# Patient Record
Sex: Male | Born: 1935 | Race: White | Hispanic: No | Marital: Married | Smoking: Former smoker
Health system: Southern US, Community
[De-identification: ages and names within clinical notes are randomized; demographics above are authoritative.]

## PROBLEM LIST (undated history)

## (undated) DIAGNOSIS — I472 Ventricular tachycardia, unspecified: Secondary | ICD-10-CM

## (undated) DIAGNOSIS — I2510 Atherosclerotic heart disease of native coronary artery without angina pectoris: Secondary | ICD-10-CM

## (undated) DIAGNOSIS — R0602 Shortness of breath: Secondary | ICD-10-CM

## (undated) DIAGNOSIS — R55 Syncope and collapse: Secondary | ICD-10-CM

## (undated) DIAGNOSIS — R079 Chest pain, unspecified: Secondary | ICD-10-CM

## (undated) DIAGNOSIS — R42 Dizziness and giddiness: Secondary | ICD-10-CM

## (undated) DIAGNOSIS — I10 Essential (primary) hypertension: Secondary | ICD-10-CM

## (undated) DIAGNOSIS — F0390 Unspecified dementia without behavioral disturbance: Secondary | ICD-10-CM

## (undated) HISTORY — DX: Unspecified dementia, unspecified severity, without behavioral disturbance, psychotic disturbance, mood disturbance, and anxiety: F03.90

## (undated) HISTORY — DX: Ventricular tachycardia: I47.2

## (undated) HISTORY — DX: Shortness of breath: R06.02

## (undated) HISTORY — DX: Atherosclerotic heart disease of native coronary artery without angina pectoris: I25.10

## (undated) HISTORY — DX: Dizziness and giddiness: R42

## (undated) HISTORY — DX: Ventricular tachycardia, unspecified: I47.20

## (undated) HISTORY — DX: Chest pain, unspecified: R07.9

## (undated) HISTORY — DX: Essential (primary) hypertension: I10

## (undated) HISTORY — DX: Syncope and collapse: R55

## (undated) HISTORY — PX: INSERT / REPLACE / REMOVE PACEMAKER: SUR710

---

## 2016-05-27 NOTE — Progress Notes (Signed)
Electrophysiology Office Note   Date:  05/28/2016   ID:  Erik Alvarez, DOB 1929-11-13, MRN [id number]  PCP:  Reymundo Poll, MD Primary Electrophysiologist:  Constance Haw, MD    Chief Complaint  Patient presents with  . Advice Only    pacemaker check     History of Present Illness: Erik Alvarez is a 82 y.o. male who presents today for electrophysiology evaluation.   He has a history of coronary artery disease, hypertension, and an ICD was implanted secondary to syncope with inducible ventricular tachycardia. He also has a history of dementia.    Today, he denies symptoms of palpitations, chest pain, shortness of breath, orthopnea, PND, lower extremity edema, claudication, dizziness, presyncope, syncope, bleeding, or neurologic sequela. The patient is tolerating medications without difficulties and is otherwise without complaint today.    Past Medical History:  Diagnosis Date  . Chest pain   . Coronary artery disease   . Dementia   . Dizziness   . Hypertension   . SOB (shortness of breath)   . Syncope   . VT (ventricular tachycardia) ([place])    Past Surgical History:  Procedure Laterality Date  . INSERT / REPLACE / REMOVE PACEMAKER       Current Outpatient Prescriptions  Medication Sig Dispense Refill  . aspirin EC 81 MG tablet Take 81 mg by mouth daily.    . bisacodyl (BISACODYL) 5 MG EC tablet Take 10 mg by mouth daily as needed for moderate constipation.    . carvedilol (COREG) 12.5 MG tablet Take 12.5 mg by mouth 2 (two) times daily with a meal.    . ciprofloxacin (CIPRO) 250 MG tablet Take 250 mg by mouth 2 (two) times daily.    . ergocalciferol (VITAMIN D2) 50000 units capsule Take 50,000 Units by mouth once a week.    . Memantine HCl-Donepezil HCl (NAMZARIC) 28-10 MG CP24 Take by mouth daily.    . simvastatin (ZOCOR) 40 MG tablet Take 40 mg by mouth daily at 6 PM.    . tamsulosin (FLOMAX) 0.4 MG CAPS capsule Take 0.4 mg by mouth.     No current  facility-administered medications for this visit.     Allergies:   Review of patient's allergies indicates no known allergies.   Social History:  The patient  reports that he has quit smoking. He has never used smokeless tobacco. He reports that he drinks alcohol. He reports that he does not use drugs.   Family History:  The patient's family history includes Diabetes in his brother.    ROS:  Please see the history of present illness.   Otherwise, review of systems is positive for snoring, back pain, easy bruising.   All other systems are reviewed and negative.    PHYSICAL EXAM: VS:  BP (!) 110/58   Pulse 60   Ht '5\' 8"'$  (1.727 m)   Wt 147 lb 12.8 oz (67 kg)   BMI 22.47 kg/m  , BMI Body mass index is 22.47 kg/m. GEN: Well nourished, well developed, in no acute distress  HEENT: normal  Neck: no JVD, carotid bruits, or masses Cardiac: RRR; no murmurs, rubs, or gallops,no edema  Respiratory:  clear to auscultation bilaterally, normal work of breathing GI: soft, nontender, nondistended, + BS MS: no deformity or atrophy  Skin: warm and dry,  device pocket is well healed Neuro:  Strength and sensation are intact Psych: euthymic mood, full affect  EKG:  EKG is ordered today. Personal review of  the ekg ordered shows A paced, RBBB, LAFB  Device interrogation is reviewed today in detail.  See PaceArt for details. Device battery is at RRT.   Recent Labs: No results found for requested labs within last 8760 hours.    Lipid Panel  No results found for: CHOL, TRIG, HDL, CHOLHDL, VLDL, LDLCALC, LDLDIRECT   Wt Readings from Last 3 Encounters:  05/28/16 147 lb 12.8 oz (67 kg)      Other studies Reviewed: Additional studies/ records that were reviewed today include: Cardiology notes   ASSESSMENT AND PLAN:  1.  Inducible ventricular tachycardia: ICD implanted previously.  Had an episode of syncope which was the reason that EP study was performed. He has had one episode of  nonsustained VT at 60 beats since his most recent check. He does need his generator changed out currently. A long discussion with him and his wife, and his daughter about the option of GEN greater change to a pacemaker. He says that he feels like he would prefer a pacemaker and would want to be DO NOT RESUSCITATE status, with no life-saving measures done. We'll therefore plan for generator change to a pacemaker.  2. Coronary artery disease: s/p RCA stent 2010.  3. Hypertension: on coreg.  4. Hyperlipidemia: continue simvastatin.   Current medicines are reviewed at length with the patient today.   The patient does not have concerns regarding his medicines.  The following changes were made today:  none  Labs/ tests ordered today include:  No orders of the defined types were placed in this encounter.    Disposition:   FU with Timisha Mondry 3 months  Signed, Tieler Cournoyer Meredith Leeds, MD  05/28/2016 12:23 PM     [facility] [address] [place] [address] [phone] (office) [phone] (fax)

## 2016-05-28 ENCOUNTER — Ambulatory Visit (INDEPENDENT_AMBULATORY_CARE_PROVIDER_SITE_OTHER): Payer: Medicare Other | Admitting: Cardiology

## 2016-05-28 ENCOUNTER — Encounter: Payer: Self-pay | Admitting: Cardiology

## 2016-05-28 ENCOUNTER — Encounter (INDEPENDENT_AMBULATORY_CARE_PROVIDER_SITE_OTHER): Payer: Self-pay

## 2016-05-28 VITALS — BP 110/58 | HR 60 | Ht 68.0 in | Wt 147.8 lb

## 2016-05-28 DIAGNOSIS — R55 Syncope and collapse: Secondary | ICD-10-CM | POA: Diagnosis not present

## 2016-05-28 DIAGNOSIS — I472 Ventricular tachycardia, unspecified: Secondary | ICD-10-CM

## 2016-05-28 LAB — CUP PACEART INCLINIC DEVICE CHECK
Battery Voltage: 2.6 V
Brady Statistic AP VS Percent: 96.14 %
Brady Statistic RA Percent Paced: 96.17 %
Brady Statistic RV Percent Paced: 0.03 %
HIGH POWER IMPEDANCE MEASURED VALUE: 49 Ohm
HighPow Impedance: 59 Ohm
Implantable Lead Location: 753860
Implantable Lead Model: 4470
Implantable Lead Model: 6947
Implantable Lead Serial Number: 661592
Lead Channel Pacing Threshold Amplitude: 0.75 V
Lead Channel Pacing Threshold Amplitude: 0.75 V
Lead Channel Pacing Threshold Pulse Width: 0.4 ms
Lead Channel Sensing Intrinsic Amplitude: 13.625 mV
Lead Channel Sensing Intrinsic Amplitude: 3.125 mV
Lead Channel Setting Pacing Amplitude: 2 V
Lead Channel Setting Sensing Sensitivity: 0.3 mV
MDC IDC LEAD IMPLANT DT: 20101019
MDC IDC LEAD IMPLANT DT: 20101019
MDC IDC LEAD LOCATION: 753859
MDC IDC MSMT LEADCHNL RA IMPEDANCE VALUE: 437 Ohm
MDC IDC MSMT LEADCHNL RV IMPEDANCE VALUE: 437 Ohm
MDC IDC MSMT LEADCHNL RV PACING THRESHOLD PULSEWIDTH: 0.4 ms
MDC IDC SESS DTM: 20171003155149
MDC IDC SET LEADCHNL RA PACING AMPLITUDE: 2 V
MDC IDC SET LEADCHNL RV PACING PULSEWIDTH: 0.4 ms
MDC IDC STAT BRADY AP VP PERCENT: 0.03 %
MDC IDC STAT BRADY AS VP PERCENT: 0 %
MDC IDC STAT BRADY AS VS PERCENT: 3.83 %

## 2016-05-28 NOTE — Patient Instructions (Signed)
Medication Instructions:    Your physician recommends that you continue on your current medications as directed. Please refer to the Current Medication list given to you today.  --- If you need a refill on your cardiac medications before your next appointment, please call your pharmacy. ---  Labwork:  None  ordered  Testing/Procedures: Your physician has recommended that you have your defibrillator downgraded to a pacemaker.  Aruna Nestler, RN will call you to arrange this procedure.  Follow-Up:  To be determined once procedure is scheduled.  Thank you for choosing [facility]!!   Trinidad Curet, RN [phone]  Any Other Special Instructions Will Be Listed Below (If Applicable).

## 2016-05-30 ENCOUNTER — Emergency Department (HOSPITAL_COMMUNITY)
Admission: EM | Admit: 2016-05-30 | Discharge: 2016-05-30 | Disposition: A | Discharge status: Home / Self Care | Payer: Medicare Other | Attending: Emergency Medicine | Admitting: Emergency Medicine

## 2016-05-30 DIAGNOSIS — Y9389 Activity, other specified: Secondary | ICD-10-CM | POA: Diagnosis not present

## 2016-05-30 DIAGNOSIS — Z87891 Personal history of nicotine dependence: Secondary | ICD-10-CM | POA: Insufficient documentation

## 2016-05-30 DIAGNOSIS — I2510 Atherosclerotic heart disease of native coronary artery without angina pectoris: Secondary | ICD-10-CM | POA: Diagnosis not present

## 2016-05-30 DIAGNOSIS — Z79899 Other long term (current) drug therapy: Secondary | ICD-10-CM | POA: Insufficient documentation

## 2016-05-30 DIAGNOSIS — W19XXXA Unspecified fall, initial encounter: Secondary | ICD-10-CM | POA: Insufficient documentation

## 2016-05-30 DIAGNOSIS — Z7982 Long term (current) use of aspirin: Secondary | ICD-10-CM | POA: Diagnosis not present

## 2016-05-30 DIAGNOSIS — Y999 Unspecified external cause status: Secondary | ICD-10-CM | POA: Insufficient documentation

## 2016-05-30 DIAGNOSIS — Z950 Presence of cardiac pacemaker: Secondary | ICD-10-CM | POA: Diagnosis not present

## 2016-05-30 DIAGNOSIS — I10 Essential (primary) hypertension: Secondary | ICD-10-CM | POA: Insufficient documentation

## 2016-05-30 DIAGNOSIS — Y92009 Unspecified place in unspecified non-institutional (private) residence as the place of occurrence of the external cause: Secondary | ICD-10-CM | POA: Insufficient documentation

## 2016-05-30 DIAGNOSIS — R55 Syncope and collapse: Secondary | ICD-10-CM | POA: Insufficient documentation

## 2016-05-30 LAB — BASIC METABOLIC PANEL
ANION GAP: 9 (ref 5–15)
BUN: 17 mg/dL (ref 6–20)
CHLORIDE: 104 mmol/L (ref 101–111)
CO2: 23 mmol/L (ref 22–32)
Calcium: 8.9 mg/dL (ref 8.9–10.3)
Creatinine, Ser: 1.07 mg/dL (ref 0.61–1.24)
GFR calc non Af Amer: >60 mL/min (ref >60)
GLUCOSE: 111 mg/dL — AB (ref 65–99)
POTASSIUM: 4.8 mmol/L (ref 3.5–5.1)
Sodium: 136 mmol/L (ref 135–145)

## 2016-05-30 LAB — CBC WITH DIFFERENTIAL/PLATELET
Basophils Absolute: 0 K/uL (ref 0.0–0.1)
Basophils Relative: 0 %
Eosinophils Absolute: 0.1 K/uL (ref 0.0–0.7)
Eosinophils Relative: 1 %
HCT: 36 % — ABNORMAL LOW (ref 39.0–52.0)
Hemoglobin: 11.7 g/dL — ABNORMAL LOW (ref 13.0–17.0)
Lymphocytes Relative: 18 %
Lymphs Abs: 1.2 K/uL (ref 0.7–4.0)
MCH: 29.3 pg (ref 26.0–34.0)
MCHC: 32.5 g/dL (ref 30.0–36.0)
MCV: 90 fL (ref 78.0–100.0)
Monocytes Absolute: 0.9 K/uL (ref 0.1–1.0)
Monocytes Relative: 13 %
Neutro Abs: 4.6 K/uL (ref 1.7–7.7)
Neutrophils Relative %: 68 %
Platelets: 158 K/uL (ref 150–400)
RBC: 4 MIL/uL — ABNORMAL LOW (ref 4.22–5.81)
RDW: 12.8 % (ref 11.5–15.5)
WBC: 6.8 K/uL (ref 4.0–10.5)

## 2016-05-30 LAB — URINE MICROSCOPIC-ADD ON

## 2016-05-30 LAB — URINALYSIS, ROUTINE W REFLEX MICROSCOPIC
Bilirubin Urine: NEGATIVE
Glucose, UA: 250 mg/dL — AB
Ketones, ur: NEGATIVE mg/dL
Nitrite: NEGATIVE
Protein, ur: 30 mg/dL — AB
Specific Gravity, Urine: 1.023 (ref 1.005–1.030)
pH: 5.5 (ref 5.0–8.0)

## 2016-05-30 MED ORDER — SODIUM CHLORIDE 0.9 % IV BOLUS (SEPSIS)
500.0000 mL | Freq: Once | INTRAVENOUS | Status: AC
  Administered 2016-05-30: 500 mL via INTRAVENOUS

## 2016-05-30 NOTE — ED Provider Notes (Signed)
[facility] DEPT Provider Note   CSN: [id number] Arrival date & time: 05/30/16  1321   LEVEL 5 CAVEAT - DEMENTIA  History   Chief Complaint Chief Complaint  Patient presents with  . Fall    HPI Erik Alvarez is a 82 y.o. male.  HPI  82 year old male with a history of dementia, ventricular tachycardia with current pacemaker/ICD, presents with his daughter by EMS after syncope. The history is taken from the daughter. She states that patient and his wife live in independent living. He was in the kitchen making breakfast when all of a sudden he went unresponsive and slid down to the floor. He did not appear to hit his head or injure himself. Wife states he was completely unresponsive for about one or 2 minutes. Seem to be confused when he woke up with EMS around him. No seizure like activity. Currently in the ED the patient is at his mental baseline. Patient has not been ill recently say for multiple episodes of urinary retention. He has had multiple Foley's over the last 6 weeks. Was recently saw his urologist 3 days ago and had a new Foley placed because he cannot urinate on his own. He was also placed on Cipro. He has not had any recent vomiting or diarrhea. He has been eating okay but drinks very little water and only drinks when he eats. Daughter has noticed over the last 3 days that his urine output has been less through the Foley. Patient has not been complaining of or currently complaining of headache, chest pain, or shortness of breath. He currently feels lightheaded but denies headache or room spinning.   Past Medical History:  Diagnosis Date  . Chest pain   . Coronary artery disease   . Dementia   . Dizziness   . Hypertension   . SOB (shortness of breath)   . Syncope   . VT (ventricular tachycardia) ([place])     There are no active problems to display for this patient.   Past Surgical History:  Procedure Laterality Date  . INSERT / REPLACE / REMOVE PACEMAKER      OB  History    No data available       Home Medications    Prior to Admission medications   Medication Sig Start Date End Date Taking? Authorizing Provider  aspirin EC 81 MG tablet Take 81 mg by mouth daily.   Yes Historical Provider, MD  carvedilol (COREG) 12.5 MG tablet Take 12.5 mg by mouth 2 (two) times daily with a meal.   Yes Historical Provider, MD  ciprofloxacin (CIPRO) 250 MG tablet Take 250 mg by mouth 2 (two) times daily.   Yes Historical Provider, MD  ergocalciferol (VITAMIN D2) 50000 units capsule Take 50,000 Units by mouth once a week.   Yes Historical Provider, MD  Memantine HCl-Donepezil HCl (NAMZARIC) 28-10 MG CP24 Take by mouth daily.   Yes Historical Provider, MD  simvastatin (ZOCOR) 40 MG tablet Take 40 mg by mouth daily at 6 PM.   Yes Historical Provider, MD  tamsulosin (FLOMAX) 0.4 MG CAPS capsule Take 0.4 mg by mouth daily.    Yes Historical Provider, MD    Family History Family History  Problem Relation Age of Onset  . Diabetes Brother     Social History Social History  Substance Use Topics  . Smoking status: Former Research scientist (life sciences)  . Smokeless tobacco: Never Used  . Alcohol use Yes     Allergies   Review of patient's allergies  indicates no known allergies.   Review of Systems Review of Systems  Unable to perform ROS: Dementia     Physical Exam Updated Vital Signs BP 121/99   Pulse (!) 59   Temp 98.1 F (36.7 C) (Oral)   Resp 18   SpO2 97%   Physical Exam  Constitutional: He appears well-developed and well-nourished. No distress.  HENT:  Head: Normocephalic and atraumatic.  Right Ear: External ear normal.  Left Ear: External ear normal.  Nose: Nose normal.  Mouth/Throat: Oropharynx is clear and moist.  Eyes: EOM are normal. Right eye exhibits no discharge. Left eye exhibits no discharge.  Pin point pupils bilaterally  Neck: Neck supple.  Cardiovascular: Normal rate, regular rhythm and normal heart sounds.   Pulmonary/Chest: Effort normal and  breath sounds normal.  Abdominal: Soft. There is no tenderness.  Genitourinary:  Genitourinary Comments: Foley catheter in place. Yellow urine in foley bag  Musculoskeletal: He exhibits no edema.  Neurological: He is alert. He is disoriented.  Alert and oriented to self and place. Knows that it is fall and is off on day of week by one day. Does not know month or year. This is baseline per daughter CN 3-12 grossly intact. 5/5 strength in all 4 extremities. Grossly normal sensation. Normal finger to nose.   Skin: Skin is warm and dry. He is not diaphoretic.  Nursing note and vitals reviewed.    ED Treatments / Results  Labs (all labs ordered are listed, but only abnormal results are displayed) Labs Reviewed  BASIC METABOLIC PANEL - Abnormal; Notable for the following:       Result Value   Glucose, Bld 111 (*)    All other components within normal limits  CBC WITH DIFFERENTIAL/PLATELET - Abnormal; Notable for the following:    RBC 4.00 (*)    Hemoglobin 11.7 (*)    HCT 36.0 (*)    All other components within normal limits  URINALYSIS, ROUTINE W REFLEX MICROSCOPIC (NOT AT [facility]) - Abnormal; Notable for the following:    Color, Urine AMBER (*)    APPearance CLOUDY (*)    Glucose, UA 250 (*)    Hgb urine dipstick SMALL (*)    Protein, ur 30 (*)    Leukocytes, UA MODERATE (*)    All other components within normal limits  URINE MICROSCOPIC-ADD ON - Abnormal; Notable for the following:    Squamous Epithelial / LPF 0-5 (*)    Bacteria, UA RARE (*)    All other components within normal limits  URINE CULTURE    EKG  EKG Interpretation  Date/Time:  Thursday May 30 2016 13:26:11 EDT Ventricular Rate:  61 PR Interval:    QRS Duration: 135 QT Interval:  453 QTC Calculation: 453 R Axis:   -64 Text Interpretation:  Normal sinus rhythm Atrial premature complex Nonspecific IVCD with LAD Inferior infarct, old No old tracing to compare Confirmed by Cecil Bixby MD, Arcenio Mullaly [phone]) on  05/30/2016 1:49:36 PM       Radiology No results found.  Procedures Procedures (including critical care time)  Medications Ordered in ED Medications  sodium chloride 0.9 % bolus 500 mL (0 mLs Intravenous Stopped 05/30/16 1530)     Initial Impression / Assessment and Plan / ED Course  I have reviewed the triage vital signs and the nursing notes.  Pertinent labs & imaging results that were available during my care of the patient were reviewed by me and considered in my medical decision making (see chart for  details).  Clinical Course  Comment By Time  Will check labs, urine/culture (although already on cipro, this is more for the culture to r/o resistance to cipro) and interrogate medtronic pacer/icd. Fluids. Sherwood Gambler, MD 10/05 1406  Medtronic interrogation shows no arrhythmias or shocks. Low battery (which daughter knows about). Waiting on labs Sherwood Gambler, MD 10/05 1438  Patient feels better, no further lightheadedness after eating 2 protein bards. Hemoglobin mildly low, but above 9/10 when at OSH. BMP benign. Urine culture will be sent but he's already on cipro, would only need changes if resistant. However no abd pain, fevers, vomiting to suggest infection.  Sherwood Gambler, MD 10/05 1637    Pacemaker interrogation shows no acute events. No obvious cause for patient's syncope. Does not seem consistent with seizures. Given he was still lightheaded on arrival and better after fluids and eating, I think it was probably from poor oral intake. Labs benign. Currently on Cipro for urine retention, will need this changed if urine culture shows resistance. Otherwise follow-up with PCP. Discussed return precautions. Is acting at his mental baseline.  Final Clinical Impressions(s) / ED Diagnoses   Final diagnoses:  Syncope, unspecified syncope type    New Prescriptions Discharge Medication List as of 05/30/2016  4:38 PM       Sherwood Gambler, MD 05/30/16 1715

## 2016-05-30 NOTE — ED Triage Notes (Signed)
Patient at home making coffee. Pt states that he became dizzy and fell.  C/O pain in bilateral flanks.  [facility] per EMS

## 2016-05-31 LAB — URINE CULTURE

## 2016-05-31 NOTE — Addendum Note (Signed)
Addended by: Stanton Kidney on: 05/31/2016 10:43 AM   Modules accepted: Orders

## 2016-06-02 ENCOUNTER — Encounter (HOSPITAL_COMMUNITY): Payer: Self-pay | Admitting: *Deleted

## 2016-06-02 ENCOUNTER — Emergency Department (HOSPITAL_COMMUNITY)
Admission: EM | Admit: 2016-06-02 | Discharge: 2016-06-02 | Disposition: A | Discharge status: Home / Self Care | Payer: Medicare Other | Attending: Emergency Medicine | Admitting: Emergency Medicine

## 2016-06-02 ENCOUNTER — Emergency Department (HOSPITAL_COMMUNITY): Payer: Medicare Other

## 2016-06-02 DIAGNOSIS — Z7982 Long term (current) use of aspirin: Secondary | ICD-10-CM | POA: Insufficient documentation

## 2016-06-02 DIAGNOSIS — Z79899 Other long term (current) drug therapy: Secondary | ICD-10-CM | POA: Diagnosis not present

## 2016-06-02 DIAGNOSIS — M5440 Lumbago with sciatica, unspecified side: Secondary | ICD-10-CM | POA: Diagnosis not present

## 2016-06-02 DIAGNOSIS — M545 Low back pain: Secondary | ICD-10-CM | POA: Diagnosis present

## 2016-06-02 DIAGNOSIS — Z87891 Personal history of nicotine dependence: Secondary | ICD-10-CM | POA: Insufficient documentation

## 2016-06-02 DIAGNOSIS — I2510 Atherosclerotic heart disease of native coronary artery without angina pectoris: Secondary | ICD-10-CM | POA: Diagnosis not present

## 2016-06-02 DIAGNOSIS — I10 Essential (primary) hypertension: Secondary | ICD-10-CM | POA: Diagnosis not present

## 2016-06-02 MED ORDER — HYDROMORPHONE HCL 1 MG/ML IJ SOLN
1.0000 mg | Freq: Once | INTRAMUSCULAR | Status: AC
  Administered 2016-06-02: 1 mg via INTRAMUSCULAR
  Filled 2016-06-02: qty 1

## 2016-06-02 MED ORDER — TRAMADOL HCL 50 MG PO TABS: ORAL_TABLET | ORAL | 0 refills | Status: DC

## 2016-06-02 NOTE — ED Triage Notes (Signed)
Pt arrived [facility] by EMS. C/o mid to lower back pain, no abdominal pain or radiating to anywhere. Pain does increase with movement. No recent trauma. Per Staff no fever, hematuria, or h/o renal disease. Has had several visit to ED with Back pain.  Does not remember what treatment what used. No neuro deficits and PMS intact.

## 2016-06-02 NOTE — ED Provider Notes (Signed)
[facility] DEPT Provider Note   CSN: [id number] Arrival date & time: 06/02/16  0806     History   Chief Complaint Chief Complaint  Patient presents with  . Back Pain    HPI Erik Alvarez is a 82 y.o. male.  Patient fell last week onto his buttocks.   The history is provided by a relative. No language interpreter was used.  Back Pain   This is a recurrent problem. The current episode started more than 2 days ago. The problem occurs constantly. The problem has not changed since onset.Associated with: fall. The pain is present in the lumbar spine. The quality of the pain is described as shooting. The pain does not radiate. The pain is at a severity of 5/10. The pain is moderate. The symptoms are aggravated by bending. The pain is worse during the day. Pertinent negatives include no chest pain, no headaches and no abdominal pain.    Past Medical History:  Diagnosis Date  . Chest pain   . Coronary artery disease   . Dementia   . Dizziness   . Hypertension   . SOB (shortness of breath)   . Syncope   . VT (ventricular tachycardia) ([place])     There are no active problems to display for this patient.   Past Surgical History:  Procedure Laterality Date  . INSERT / REPLACE / REMOVE PACEMAKER      OB History    No data available       Home Medications    Prior to Admission medications   Medication Sig Start Date End Date Taking? Authorizing Provider  aspirin EC 81 MG tablet Take 81 mg by mouth daily.   Yes Historical Provider, MD  carvedilol (COREG) 12.5 MG tablet Take 12.5 mg by mouth 2 (two) times daily with a meal.   Yes Historical Provider, MD  ergocalciferol (VITAMIN D2) 50000 units capsule Take 50,000 Units by mouth once a week.   Yes Historical Provider, MD  Memantine HCl-Donepezil HCl (NAMZARIC) 28-10 MG CP24 Take 1 tablet by mouth daily.    Yes Historical Provider, MD  simvastatin (ZOCOR) 40 MG tablet Take 40 mg by mouth daily at 6 PM.   Yes Historical  Provider, MD  tamsulosin (FLOMAX) 0.4 MG CAPS capsule Take 0.4 mg by mouth daily.    Yes Historical Provider, MD  traMADol (ULTRAM) 50 MG tablet Take 1 every 6 hours for pain not helped by Tylenol 06/02/16   Milton Ferguson, MD    Family History Family History  Problem Relation Age of Onset  . Diabetes Brother     Social History Social History  Substance Use Topics  . Smoking status: Former Research scientist (life sciences)  . Smokeless tobacco: Never Used  . Alcohol use Yes     Allergies   Review of patient's allergies indicates no known allergies.   Review of Systems Review of Systems  Constitutional: Negative for appetite change and fatigue.  HENT: Negative for congestion, ear discharge and sinus pressure.   Eyes: Negative for discharge.  Respiratory: Negative for cough.   Cardiovascular: Negative for chest pain.  Gastrointestinal: Negative for abdominal pain and diarrhea.  Genitourinary: Negative for frequency and hematuria.  Musculoskeletal: Positive for back pain.  Skin: Negative for rash.  Neurological: Negative for seizures and headaches.  Psychiatric/Behavioral: Negative for hallucinations.     Physical Exam Updated Vital Signs BP 144/59 (BP Location: Left Arm)   Pulse 60   Temp 97.7 F (36.5 C) (Oral)   Resp  19   SpO2 94%   Physical Exam  Constitutional: He is oriented to person, place, and time. He appears well-developed.  HENT:  Head: Normocephalic.  Eyes: Conjunctivae and EOM are normal. No scleral icterus.  Neck: Neck supple. No thyromegaly present.  Cardiovascular: Normal rate and regular rhythm.  Exam reveals no gallop and no friction rub.   No murmur heard. Pulmonary/Chest: No stridor. He has no wheezes. He has no rales. He exhibits no tenderness.  Abdominal: He exhibits no distension. There is no tenderness. There is no rebound.  Musculoskeletal: Normal range of motion. He exhibits no edema.  Tender lumbar spine  Lymphadenopathy:    He has no cervical adenopathy.    Neurological: He is oriented to person, place, and time. He exhibits normal muscle tone. Coordination normal.  Skin: No rash noted. No erythema.  Psychiatric: He has a normal mood and affect. His behavior is normal.     ED Treatments / Results  Labs (all labs ordered are listed, but only abnormal results are displayed) Labs Reviewed - No data to display  EKG  EKG Interpretation None       Radiology Dg Lumbar Spine Complete  Result Date: 06/02/2016 CLINICAL DATA:  Lumbago EXAM: LUMBAR SPINE - COMPLETE 4+ VIEW COMPARISON:  None. FINDINGS: Frontal, lateral, spot lumbosacral lateral, and bilateral oblique views were obtained. There are 5 non-rib-bearing lumbar type vertebral bodies. There is mild rotatory scoliosis. There is no fracture or spondylolisthesis. There is moderately severe disc space narrowing at L4-5 and L5-S1. There is moderate disc space narrowing at L3-4. There is facet osteoarthritic change at L4-5 and L5-S1 bilaterally. There is extensive calcification in the aorta and iliac arteries. IMPRESSION: Osteoarthritic changes several levels. Mild scoliosis. No fracture or spondylolisthesis. There is extensive aortoiliac atherosclerosis. Electronically Signed   By: Lowella Grip III M.D.   On: 06/02/2016 09:55   Dg Pelvis 1-2 Views  Result Date: 06/02/2016 CLINICAL DATA:  Pain EXAM: PELVIS - 1-2 VIEW COMPARISON:  None. FINDINGS: There is no evidence of pelvic fracture or dislocation. Hip joints appear symmetric bilaterally. Sacroiliac joints appear symmetric bilaterally. No erosive change. IMPRESSION: No fracture or dislocation.  The joint spaces appear intact. Electronically Signed   By: Lowella Grip III M.D.   On: 06/02/2016 09:56    Procedures Procedures (including critical care time)  Medications Ordered in ED Medications  HYDROmorphone (DILAUDID) injection 1 mg (1 mg Intramuscular Given 06/02/16 1022)     Initial Impression / Assessment and Plan / ED Course   I have reviewed the triage vital signs and the nursing notes.  Pertinent labs & imaging results that were available during my care of the patient were reviewed by me and considered in my medical decision making (see chart for details).  Clinical Course   Pt with no fx on x-ray.  tx with tylenol and ultram and follow up  Final Clinical Impressions(s) / ED Diagnoses   Final diagnoses:  Bilateral low back pain with sciatica, sciatica laterality unspecified, unspecified chronicity    New Prescriptions New Prescriptions   TRAMADOL (ULTRAM) 50 MG TABLET    Take 1 every 6 hours for pain not helped by Tylenol     Milton Ferguson, MD 06/02/16 1140

## 2016-06-02 NOTE — Discharge Instructions (Signed)
Follow up with your md as needed °

## 2016-06-02 NOTE — ED Notes (Signed)
Patient transported to X-ray 

## 2016-06-02 NOTE — ED Notes (Signed)
Bed: IZ12 Expected date: 06/02/16 Expected time: 8:04 AM Means of arrival: Ambulance Comments: Back pain from [facility]

## 2016-06-02 NOTE — ED Notes (Signed)
Family at bedside, Daughter Baker Janus

## 2016-06-03 ENCOUNTER — Telehealth: Payer: Self-pay | Admitting: Cardiology

## 2016-06-03 NOTE — Telephone Encounter (Signed)
New message      Calling to follow up on pt needing a battery changeout.  Has this been scheduled?  Please call

## 2016-06-03 NOTE — Telephone Encounter (Signed)
Spoke with dtr.  Will discuss implant with Dr. Curt Bears tomorrow and call her back.  She will discuss pt care w/ his PCP tomorrow.  She will also get [place] hospital information for me.

## 2016-06-06 ENCOUNTER — Encounter: Payer: Self-pay | Admitting: Cardiology

## 2016-06-07 ENCOUNTER — Encounter: Payer: Self-pay | Admitting: Cardiology

## 2016-06-12 ENCOUNTER — Encounter: Payer: Self-pay | Admitting: Cardiology

## 2016-06-14 ENCOUNTER — Encounter: Payer: Self-pay | Admitting: *Deleted

## 2016-06-14 NOTE — Telephone Encounter (Signed)
ICD downgrade to PPM scheduled 11/2. Pre procedure labs 10/25. Wound check scheduled for 11/16. Letter of instructions reviewed with dtr and sent through [facility]. Dtr verbalized understanding and agreeable to plan.

## 2016-06-19 ENCOUNTER — Other Ambulatory Visit (INDEPENDENT_AMBULATORY_CARE_PROVIDER_SITE_OTHER): Payer: Medicare Other | Admitting: *Deleted

## 2016-06-19 ENCOUNTER — Other Ambulatory Visit: Payer: Self-pay | Admitting: *Deleted

## 2016-06-19 ENCOUNTER — Other Ambulatory Visit: Payer: Self-pay | Admitting: Cardiology

## 2016-06-19 DIAGNOSIS — I472 Ventricular tachycardia, unspecified: Secondary | ICD-10-CM

## 2016-06-19 LAB — BASIC METABOLIC PANEL
BUN: 24 mg/dL (ref 7–25)
CALCIUM: 8.8 mg/dL (ref 8.6–10.3)
CO2: 24 mmol/L (ref 20–31)
CREATININE: 1.3 mg/dL — AB (ref 0.70–1.11)
Chloride: 102 mmol/L (ref 98–110)
GLUCOSE: 211 mg/dL — AB (ref 65–99)
Potassium: 4.7 mmol/L (ref 3.5–5.3)
SODIUM: 138 mmol/L (ref 135–146)

## 2016-06-19 LAB — CBC WITH DIFFERENTIAL/PLATELET
BASOS ABS: 0 {cells}/uL (ref 0–200)
Basophils Relative: 0 %
EOS ABS: 86 {cells}/uL (ref 15–500)
EOS PCT: 1 %
HCT: 32.7 % — ABNORMAL LOW (ref 38.5–50.0)
HEMOGLOBIN: 11.1 g/dL — AB (ref 13.2–17.1)
LYMPHS ABS: 1204 {cells}/uL (ref 850–3900)
Lymphocytes Relative: 14 %
MCH: 29.4 pg (ref 27.0–33.0)
MCHC: 33.9 g/dL (ref 32.0–36.0)
MCV: 86.5 fL (ref 80.0–100.0)
MPV: 9.4 fL (ref 7.5–12.5)
Monocytes Absolute: 946 cells/uL (ref 200–950)
Monocytes Relative: 11 %
NEUTROS PCT: 74 %
Neutro Abs: 6364 cells/uL (ref 1500–7800)
Platelets: 308 10*3/uL (ref 140–400)
RBC: 3.78 MIL/uL — ABNORMAL LOW (ref 4.20–5.80)
RDW: 12.9 % (ref 11.0–15.0)
WBC: 8.6 10*3/uL (ref 3.8–10.8)

## 2016-06-20 ENCOUNTER — Encounter: Payer: Self-pay | Admitting: *Deleted

## 2016-06-22 ENCOUNTER — Observation Stay (HOSPITAL_COMMUNITY): Payer: Medicare Other

## 2016-06-22 ENCOUNTER — Encounter (HOSPITAL_COMMUNITY): Payer: Self-pay | Admitting: Emergency Medicine

## 2016-06-22 ENCOUNTER — Emergency Department (HOSPITAL_COMMUNITY): Payer: Medicare Other

## 2016-06-22 ENCOUNTER — Inpatient Hospital Stay (HOSPITAL_COMMUNITY)
Admission: EM | Admit: 2016-06-22 | Discharge: 2016-06-28 | DRG: 180 | Disposition: A | Discharge status: Hospice | Payer: Medicare Other | Attending: Family Medicine | Admitting: Family Medicine

## 2016-06-22 DIAGNOSIS — Z955 Presence of coronary angioplasty implant and graft: Secondary | ICD-10-CM

## 2016-06-22 DIAGNOSIS — C782 Secondary malignant neoplasm of pleura: Secondary | ICD-10-CM | POA: Diagnosis not present

## 2016-06-22 DIAGNOSIS — I2510 Atherosclerotic heart disease of native coronary artery without angina pectoris: Secondary | ICD-10-CM | POA: Diagnosis present

## 2016-06-22 DIAGNOSIS — Z79891 Long term (current) use of opiate analgesic: Secondary | ICD-10-CM

## 2016-06-22 DIAGNOSIS — C3490 Malignant neoplasm of unspecified part of unspecified bronchus or lung: Secondary | ICD-10-CM | POA: Diagnosis present

## 2016-06-22 DIAGNOSIS — I252 Old myocardial infarction: Secondary | ICD-10-CM

## 2016-06-22 DIAGNOSIS — I255 Ischemic cardiomyopathy: Secondary | ICD-10-CM | POA: Diagnosis present

## 2016-06-22 DIAGNOSIS — Y92099 Unspecified place in other non-institutional residence as the place of occurrence of the external cause: Secondary | ICD-10-CM

## 2016-06-22 DIAGNOSIS — J910 Malignant pleural effusion: Secondary | ICD-10-CM | POA: Diagnosis present

## 2016-06-22 DIAGNOSIS — R079 Chest pain, unspecified: Secondary | ICD-10-CM

## 2016-06-22 DIAGNOSIS — I313 Pericardial effusion (noninflammatory): Secondary | ICD-10-CM | POA: Diagnosis present

## 2016-06-22 DIAGNOSIS — J90 Pleural effusion, not elsewhere classified: Secondary | ICD-10-CM

## 2016-06-22 DIAGNOSIS — N401 Enlarged prostate with lower urinary tract symptoms: Secondary | ICD-10-CM | POA: Diagnosis present

## 2016-06-22 DIAGNOSIS — Z95810 Presence of automatic (implantable) cardiac defibrillator: Secondary | ICD-10-CM

## 2016-06-22 DIAGNOSIS — F0390 Unspecified dementia without behavioral disturbance: Secondary | ICD-10-CM | POA: Diagnosis present

## 2016-06-22 DIAGNOSIS — R4189 Other symptoms and signs involving cognitive functions and awareness: Secondary | ICD-10-CM

## 2016-06-22 DIAGNOSIS — I249 Acute ischemic heart disease, unspecified: Secondary | ICD-10-CM | POA: Diagnosis present

## 2016-06-22 DIAGNOSIS — R41841 Cognitive communication deficit: Secondary | ICD-10-CM | POA: Diagnosis not present

## 2016-06-22 DIAGNOSIS — C459 Mesothelioma, unspecified: Secondary | ICD-10-CM | POA: Diagnosis present

## 2016-06-22 DIAGNOSIS — I10 Essential (primary) hypertension: Secondary | ICD-10-CM | POA: Diagnosis present

## 2016-06-22 DIAGNOSIS — Z87891 Personal history of nicotine dependence: Secondary | ICD-10-CM

## 2016-06-22 DIAGNOSIS — J9811 Atelectasis: Secondary | ICD-10-CM | POA: Diagnosis present

## 2016-06-22 DIAGNOSIS — Z79899 Other long term (current) drug therapy: Secondary | ICD-10-CM

## 2016-06-22 DIAGNOSIS — Z7982 Long term (current) use of aspirin: Secondary | ICD-10-CM

## 2016-06-22 DIAGNOSIS — Z66 Do not resuscitate: Secondary | ICD-10-CM

## 2016-06-22 DIAGNOSIS — W19XXXA Unspecified fall, initial encounter: Secondary | ICD-10-CM | POA: Diagnosis present

## 2016-06-22 DIAGNOSIS — Z515 Encounter for palliative care: Secondary | ICD-10-CM | POA: Diagnosis present

## 2016-06-22 DIAGNOSIS — J9601 Acute respiratory failure with hypoxia: Secondary | ICD-10-CM

## 2016-06-22 DIAGNOSIS — R338 Other retention of urine: Secondary | ICD-10-CM | POA: Diagnosis present

## 2016-06-22 DIAGNOSIS — E785 Hyperlipidemia, unspecified: Secondary | ICD-10-CM | POA: Diagnosis present

## 2016-06-22 LAB — CBC
HEMATOCRIT: 30.9 % — AB (ref 39.0–52.0)
HEMOGLOBIN: 10.2 g/dL — AB (ref 13.0–17.0)
MCH: 28.5 pg (ref 26.0–34.0)
MCHC: 33 g/dL (ref 30.0–36.0)
MCV: 86.3 fL (ref 78.0–100.0)
Platelets: 274 10*3/uL (ref 150–400)
RBC: 3.58 MIL/uL — ABNORMAL LOW (ref 4.22–5.81)
RDW: 13 % (ref 11.5–15.5)
WBC: 9.1 10*3/uL (ref 4.0–10.5)

## 2016-06-22 LAB — BASIC METABOLIC PANEL
ANION GAP: 10 (ref 5–15)
BUN: 25 mg/dL — ABNORMAL HIGH (ref 6–20)
CHLORIDE: 106 mmol/L (ref 101–111)
CO2: 22 mmol/L (ref 22–32)
Calcium: 8.4 mg/dL — ABNORMAL LOW (ref 8.9–10.3)
Creatinine, Ser: 1.21 mg/dL (ref 0.61–1.24)
GFR calc non Af Amer: 53 mL/min — ABNORMAL LOW (ref >60)
GLUCOSE: 219 mg/dL — AB (ref 65–99)
Potassium: 4.1 mmol/L (ref 3.5–5.1)
Sodium: 138 mmol/L (ref 135–145)

## 2016-06-22 LAB — TROPONIN I: Troponin I: 0.04 ng/mL (ref <0.03)

## 2016-06-22 MED ORDER — ACETAMINOPHEN 325 MG PO TABS
650.0000 mg | ORAL_TABLET | ORAL | Status: DC | PRN
  Administered 2016-06-27: 650 mg via ORAL
  Filled 2016-06-22: qty 2

## 2016-06-22 MED ORDER — CARVEDILOL 12.5 MG PO TABS
12.5000 mg | ORAL_TABLET | Freq: Two times a day (BID) | ORAL | Status: DC
  Filled 2016-06-22: qty 1

## 2016-06-22 MED ORDER — CARVEDILOL 12.5 MG PO TABS
12.5000 mg | ORAL_TABLET | Freq: Two times a day (BID) | ORAL | Status: DC
  Administered 2016-06-22 – 2016-06-28 (×13): 12.5 mg via ORAL
  Filled 2016-06-22 (×12): qty 1

## 2016-06-22 MED ORDER — MEMANTINE HCL 5 MG PO TABS
2.5000 mg | ORAL_TABLET | Freq: Two times a day (BID) | ORAL | Status: DC
  Administered 2016-06-22 – 2016-06-26 (×8): 2.5 mg via ORAL
  Filled 2016-06-22 (×9): qty 1

## 2016-06-22 MED ORDER — ONDANSETRON HCL 4 MG/2ML IJ SOLN: 4.0000 mg | Freq: Four times a day (QID) | INTRAMUSCULAR | Status: DC | PRN

## 2016-06-22 MED ORDER — ENOXAPARIN SODIUM 40 MG/0.4ML ~~LOC~~ SOLN
40.0000 mg | SUBCUTANEOUS | Status: DC
  Administered 2016-06-22 – 2016-06-26 (×5): 40 mg via SUBCUTANEOUS
  Filled 2016-06-22 (×5): qty 0.4

## 2016-06-22 MED ORDER — ASPIRIN EC 81 MG PO TBEC
81.0000 mg | DELAYED_RELEASE_TABLET | Freq: Every day | ORAL | Status: DC
  Administered 2016-06-23 – 2016-06-28 (×6): 81 mg via ORAL
  Filled 2016-06-22 (×6): qty 1

## 2016-06-22 MED ORDER — ASPIRIN 81 MG PO CHEW: 324.0000 mg | CHEWABLE_TABLET | Freq: Once | ORAL | Status: DC

## 2016-06-22 MED ORDER — DONEPEZIL HCL 10 MG PO TABS
10.0000 mg | ORAL_TABLET | Freq: Every day | ORAL | Status: DC
  Administered 2016-06-22: 10 mg via ORAL
  Filled 2016-06-22: qty 1

## 2016-06-22 MED ORDER — SIMVASTATIN 40 MG PO TABS
40.0000 mg | ORAL_TABLET | Freq: Every day | ORAL | Status: DC
  Administered 2016-06-23 – 2016-06-25 (×3): 40 mg via ORAL
  Filled 2016-06-22 (×3): qty 1

## 2016-06-22 MED ORDER — MEMANTINE HCL 5 MG PO TABS: 2.5000 mg | ORAL_TABLET | Freq: Two times a day (BID) | ORAL | Status: DC

## 2016-06-22 NOTE — ED Provider Notes (Signed)
Emergency Department Provider Note   I have reviewed the triage vital signs and the nursing notes.   HISTORY  Chief Complaint Chest Pain  Level 5 caveat: dementia  HPI Erik Alvarez is a 82 y.o. male with PMH of CAD, HTN, VT, and syncope presents to the emergency department for evaluation of left-sided chest pain. The patient's HPI is limited by dementia. He points to his left shoulder and chest when asked where his pain is. The patient's daughter at bedside states he is also complaining of some right-sided discomfort. He has been in increased pain since a fall recently but this seems different, according to the patient and family.    Past Medical History:  Diagnosis Date  . Chest pain   . Coronary artery disease   . Dementia   . Dizziness   . Hypertension   . SOB (shortness of breath)   . Syncope   . VT (ventricular tachycardia) [facility])     Patient Active Problem List   Diagnosis Date Noted  . Chest pain 06/22/2016  . ACS (acute coronary syndrome) ([place]) 06/22/2016    Past Surgical History:  Procedure Laterality Date  . INSERT / REPLACE / REMOVE PACEMAKER        Allergies Review of patient's allergies indicates no known allergies.  Family History  Problem Relation Age of Onset  . Diabetes Brother     Social History Social History  Substance Use Topics  . Smoking status: Former Research scientist (life sciences)  . Smokeless tobacco: Never Used  . Alcohol use Yes    Review of Systems  Constitutional: No fever/chills Eyes: No visual changes. ENT: No sore throat. Cardiovascular: Positive chest pain. Respiratory: Denies shortness of breath. Gastrointestinal: No abdominal pain.  No nausea, no vomiting.  No diarrhea.  No constipation. Genitourinary: Negative for dysuria. Musculoskeletal: Negative for back pain. Skin: Negative for rash. Neurological: Negative for headaches, focal weakness or numbness.  10-point ROS otherwise  negative.  ____________________________________________   PHYSICAL EXAM:  VITAL SIGNS: ED Triage Vitals  Enc Vitals Group     BP 06/22/16 1245 99/61     Pulse Rate 06/22/16 1251 (!) 59     Resp 06/22/16 1245 26     Temp 06/22/16 1251 97.9 F (36.6 C)     Temp Source 06/22/16 1251 Oral     SpO2 06/22/16 1251 93 %     Weight 06/22/16 1250 147 lb (66.7 kg)     Height 06/22/16 1250 '5\' 10"'$  (1.778 m)   Constitutional: Alert but confused at times. Well appearing and in no acute distress. Eyes: Conjunctivae are normal. Head: Atraumatic. Nose: No congestion/rhinnorhea. Mouth/Throat: Mucous membranes are moist.  Oropharynx non-erythematous. Neck: No stridor.  Cardiovascular: Bradycardia. Good peripheral circulation. Grossly normal heart sounds.   Respiratory: Normal respiratory effort.  No retractions. Lungs CTAB. Gastrointestinal: Soft and nontender. No distention.  Musculoskeletal: No lower extremity tenderness nor edema. No gross deformities of extremities. Neurologic:  Normal speech and language. No gross focal neurologic deficits are appreciated.  Skin:  Skin is warm, dry and intact. No rash noted.   ____________________________________________   LABS (all labs ordered are listed, but only abnormal results are displayed)  Labs Reviewed  CBC - Abnormal; Notable for the following:       Result Value   RBC 3.58 (*)    Hemoglobin 10.2 (*)    HCT 30.9 (*)    All other components within normal limits  BASIC METABOLIC PANEL - Abnormal; Notable for the following:  Glucose, Bld 219 (*)    BUN 25 (*)    Calcium 8.4 (*)    GFR calc non Af Amer 53 (*)    All other components within normal limits  TROPONIN I - Abnormal; Notable for the following:    Troponin I 0.04 (*)    All other components within normal limits  TROPONIN I  TROPONIN I  TROPONIN I  I-STAT TROPOININ, ED   ____________________________________________  EKG   EKG Interpretation  Date/Time:  Saturday  June 22 2016 12:43:34 EDT Ventricular Rate:  60 PR Interval:    QRS Duration: 137 QT Interval:  492 QTC Calculation: 492 R Axis:   -57 Text Interpretation:  Sinus rhythm Ventricular premature complex Nonspecific IVCD with LAD Inferior infarct, old Minimal ST elevation, anterior leads No STEMI.  Confirmed by LONG MD, JOSHUA [phone]) on 06/22/2016 3:33:43 PM       ____________________________________________  RADIOLOGY  Dg Chest 2 View  Result Date: 06/22/2016 CLINICAL DATA:  Substernal chest pain, shortness of breath, syncope EXAM: CHEST  2 VIEW COMPARISON:  None. FINDINGS: Moderate to large right pleural effusion. Associated right lower lobe opacity, likely atelectasis. Left lung is clear.  No pneumothorax. The heart is normal in size.  Left subclavian ICD. Degenerative changes of the visualized thoracolumbar spine. IMPRESSION: Moderate to large right pleural effusion. Associated right lower lobe opacity, likely atelectasis. Electronically Signed   By: Julian Hy M.D.   On: 06/22/2016 13:54   Ct Head Wo Contrast  Result Date: 06/22/2016 CLINICAL DATA:  Acute mental status and low blood pressure. EXAM: CT HEAD WITHOUT CONTRAST TECHNIQUE: Contiguous axial images were obtained from the base of the skull through the vertex without intravenous contrast. COMPARISON:  None. FINDINGS: Brain: No subdural, epidural, or subarachnoid hemorrhage. Increased CSF near the left temporal horn, likely in an arachnoid cyst. Ventricles and sulci are prominent, likely due to atrophy. The cerebellum, brainstem, and basal cisterns are within normal limits. Scattered white matter changes most focal in the right frontal lobe on series 2, image 21. No acute cortical ischemia or infarct. No mass, mass effect, or midline shift. Vascular: Atherosclerotic changes seen in the intracranial carotid arteries. Skull: Normal. Negative for fracture or focal lesion. Sinuses/Orbits: No acute finding. Other: None.  IMPRESSION: 1. No acute abnormality identified. Electronically Signed   By: Dorise Bullion III M.D   On: 06/22/2016 18:50    ____________________________________________   PROCEDURES  Procedure(s) performed:   Procedures   ____________________________________________   INITIAL IMPRESSION / ASSESSMENT AND PLAN / ED COURSE  Pertinent labs & imaging results that were available during my care of the patient were reviewed by me and considered in my medical decision making (see chart for details).  Patient with history of dementia presents to the ED for evaluation of chest pain. Has history of ACS. Chest pain free on arrival. ASA and Nitro in the field with transient hypotension.   Differential includes all life-threatening causes for chest pain. This includes but is not exclusive to acute coronary syndrome, aortic dissection, pulmonary embolism, cardiac tamponade, community-acquired pneumonia, pericarditis, musculoskeletal chest wall pain, etc.   Patient's troponin came back slightly elevated at 0.04. He is no longer having chest pain. Plan for admission for enzyme trending. Discussed with admission team who will be down to evaluate and place orders.   Reviewed imaging, labs, EKG, and updated patient and family at bedside.   ____________________________________________  FINAL CLINICAL IMPRESSION(S) / ED DIAGNOSES  Final diagnoses:  Acute cognitive decline  MEDICATIONS GIVEN DURING THIS VISIT:  Medications  aspirin chewable tablet 324 mg (324 mg Oral Not Given 06/22/16 1723)  donepezil (ARICEPT) tablet 10 mg (not administered)  memantine (NAMENDA) tablet 2.5 mg (not administered)  aspirin EC tablet 81 mg (not administered)  carvedilol (COREG) tablet 12.5 mg (not administered)  simvastatin (ZOCOR) tablet 40 mg (not administered)  acetaminophen (TYLENOL) tablet 650 mg (not administered)  ondansetron (ZOFRAN) injection 4 mg (not administered)  enoxaparin (LOVENOX)  injection 40 mg (not administered)     NEW OUTPATIENT MEDICATIONS STARTED DURING THIS VISIT:  None   Note:  This document was prepared using Dragon voice recognition software and may include unintentional dictation errors.  Nanda Quinton, MD Emergency Medicine   Margette Fast, MD 06/22/16 [phone]

## 2016-06-22 NOTE — ED Notes (Signed)
Attempted report 

## 2016-06-22 NOTE — H&P (Signed)
[facility] Admission History and Physical Service Pager: [phone]  Patient name: Erik Alvarez [facility] Medical record number: [id number] Date of birth: 1930-02-13 Age: 82 y.o. Gender: male  Primary Care Provider: Reymundo Poll, MD Consultants: Cardiology  Code Status: DNR   Chief Complaint: Chest Pain   Assessment and Plan: [name] is a 82 y.o. male with a past medical history significant for CAD (s/p stent of RCA 2010), Dementia, HTN, VT(Pacemaker) presented with substernal chest pain radiating to right chest then left shoulder concerning for ACS in the setting of a history of CAD and hypertension.  #Chest Pain, acute Story is from patient's daughter as he was unable to communicate in a coherent fashion.Patient did not remember episode of chest pain. Per daughter substernal chest pain/possibly radiating to left shoulder. Symptoms description concerning for angina. Patient has a Heart score of 4.  Hx of CAD and HTN indicative of elevated risk factors. Troponin 0.04 was very mildly elevated. EKG findings consistent with prior inferior infarct, no significant ST elevation/depression. Similar to previous EKG on 9/18.CXR showed a moderate to large right-sided pleural effusion associated right lower lobe opacity. Patient recently had 1.2 cm left lower lobe nodule seen on CT abdomen pelvis on 05/03/2016. Pleural effusion finding not consistent with lung nodule location. However, pulmonary malignancy given age. Due to hx of CAD, HTN, will workup patient for ACS rule out. Pain could be secondary to referred pain from pulmonary effusion or could also be from MSK etiology with recent history of fall thought not eproducible with palpation of the chest wall and rib cage. At this juncture, our leading diagnosis would be ACS event vs pulmonary process vs MSK etiology. --Admit to FMTS telemetry, admitting physician Dr. McDiarmid  --Follow-up on EKG in the a.m.  --Will trend troponin  x3  --Continue simvastatin 40 mg daily --Continue 81 mg aspirin daily --Consider echocardiogram --Will consider consulting cardiology in the a.m. --Consider pulmonary vs. IR consult for pleural effusion  -- Obtain BNP --Placed patient on telemetry --Continue to monitor vitals --Monitor fluid intake --Tylenol 650g q4 prn --Zofran 4 mg q6 prn  # Cognitive Decline, Dementia, chronic Patient has history of dementia, previously able to perform most ADLs. Patient patient recently moved to [facility] retirement home in September. Patient is able to perform most ADLs on his own on a daily basis. However patient had a recent fall about 3 weeks ago and since that fall has had a decline in ability to take care of himself. Patient was seen in the ED for the fall and received workup including CBC, BMET, urine. There was no head imaging at that time in the absence of head trauma an/or LOC at that time.  Per family, patient with increasing instability in gait as well. Due to continued decline it is possible that patient had unwitnessed fall with minimal sign of trauma but could have a subdural hematoma. vs other neurologic findings, though neurologic exam intact. Other possibilities include arrhthymias though pacemaker in place, vs orthostatic hypotension.  --Follow-up on Head CT in the a.m.  -- Interrogation of pacemaker tomorrow -- PT/OT eval and treat --Continue memantine 2.5 mg twice a day --Continue donepezil 10 mg daily at bedtime  #Large to moderate pleural effusion, acute Chest x-ray showed moderate to large pleural effusion on the right side,though no other signs of fluid overload on physical exam. Last ECHO noted in  2012 with EF of 50%.. Patient with incidental finding on CT in September 2017 of 1.2  cm left lower lobe nodule. Imaging also showed hilar lymphadenopathy suspicious for malignancy. With this new finding on chest x-ray and further workup etiology of pulmonary effusion for malignancy vs  right heart failure should be consider.  -- Consider ECHO -- Consider Chest CT for further characterization, may need to follow up with pulmonary/IR for thoracentesis depending on results  --Monitor vital signs and respiration status  #Fall, acute Patient has had witnessed and possible unwitnessed falls since moving to his retirement home.These falls event could be related to new environment and unfamiliarity with new settings. Could also be secondary to physiologic decline. However, the way these falls have been described by family members, they are more consistent with syncopal episode. Our differential diagnosis for her syncopal episode would be vasovagal vs orthostatic vs arrhythmias. Patient had recently placed pacemaker which could be contributing to these events as well. --Order [employer] vitals  --Consult cardiology, discuss interrogate pacemaker  --PT/OT consult   #Urinary retention:  Patient with hx of urinary retention due to BPH, currently with catheter in placed. Followed by urology, plan for Urolift. --Will keep foley in -- Outpatient follow up with urology   #Hx of CAD ( s/p stent of RCA (2010) --Continue aspirin 81 mg daily  #Hyperlipidemia --Continue simvastatin 40 mg daily  FEN/GI: Heart healthy Diet  Prophylaxis: Lovenox   Disposition:  Home   History of Present Illness:   Erik Alvarez is a 82 y.o. male with a past medical history significant forCAD (s/p stent of RCA 2010), dementia, HTN, VT(Pacemaker) presented with substernal chest pain radiating to right chest wall and left shoulder. Patient with severe dementia. Received hx from daughter. Per daughter complaining of right sided chest pain and then left sided chest pain as well shoulder pain. Patient has been dizzy for about two weeks  Patient had a fall about 3 weeks ago and was worked up in the ED and discharged home in no acute findings (fracture, head trauma). Patient just recently moved to [facility] in early September. Prior to admission to [facility], patient was able to perform all ADLs without help. However, since moving, there is a marked decrease in functionality. Patient recently had a pacemaker placed in September per daughter. It was recently checked in cardiology clinic, and was functioning normally. In the ED, EKG was similar to prior read. Troponin was 0.04, slightly elevated. Chest x-ray showed a moderate to large pleural effusion on the right side.  Review Of Systems: Per HPI with the following additions:   Review of Systems  Unable to perform ROS: Dementia  All other systems reviewed and are negative.   There are no active problems to display for this patient.   Past Medical History: Past Medical History:  Diagnosis Date  . Chest pain   . Coronary artery disease   . Dementia   . Dizziness   . Hypertension   . SOB (shortness of breath)   . Syncope   . VT (ventricular tachycardia) ([place])     Past Surgical History: Past Surgical History:  Procedure Laterality Date  . INSERT / REPLACE / REMOVE PACEMAKER      Social History: Social History  Substance Use Topics  . Smoking status: Former Research scientist (life sciences)  . Smokeless tobacco: Never Used  . Alcohol use Yes   Additional social history:   Please also refer to relevant sections of EMR.  Family History: Family History  Problem Relation Age of Onset  . Diabetes Brother    (If not  completed, MUST add something in)  Allergies and Medications: No Known Allergies No current facility-administered medications on file prior to encounter.    Current Outpatient Prescriptions on File Prior to Encounter  Medication Sig Dispense Refill  . aspirin EC 81 MG tablet Take 81 mg by mouth daily.    . carvedilol (COREG) 12.5 MG tablet Take 12.5 mg by mouth 2 (two) times daily with a meal.    . ergocalciferol (VITAMIN D2) 50000 units capsule Take 50,000 Units by mouth every Wednesday.     . simvastatin (ZOCOR) 40 MG  tablet Take 40 mg by mouth daily at 6 PM.    . traMADol (ULTRAM) 50 MG tablet Take 1 every 6 hours for pain not helped by Tylenol (Patient not taking: Reported on 06/22/2016) 30 tablet 0    Objective: BP 131/65   Pulse 62   Temp 97.9 F (36.6 C) (Oral)   Resp 24   Ht '5\' 10"'$  (1.778 m)   Wt 147 lb (66.7 kg)   SpO2 92%   BMI 21.09 kg/m  Exam: Physical Exam: General Appearance:  Confused but in no acute distress and cooperative Head/face:  NCAT Eyes:  PERRL and EOMI Ears:  Canals and TMs NI Neck:  Supple, no mass, non-tender, no bruits, no jvd, Adenopathy- absent Lungs:  Normal expansion.  Clear to auscultation bilaterally.  No rales, rhonchi, or wheezing. Heart:  Normal S1-S2.  Regular rate and rhythm without murmur, gallop or rub. Abdomen: Soft, non-tender, normal bowel sounds; no bruits, organomegaly or masses. Extremities: Extremities warm to touch, pink, with no edema. and pulses present in all extremities  Musculoskeletal:  Spine range of motion normal. Muscular strength intact. Neurologic:  gait was not checked due to frailty ., strength and  sensation grossly normal Psych exam: Somnolent but alert prompted, oriented to self only, in NAD with a full range of affect, normal behavior and no psychotic features  Labs and Imaging: CBC BMET   Recent Labs Lab 06/22/16 1300  WBC 9.1  HGB 10.2*  HCT 30.9*  PLT 274    Recent Labs Lab 06/22/16 1300  NA 138  K 4.1  CL 106  CO2 22  BUN 25*  CREATININE 1.21  GLUCOSE 219*  CALCIUM 8.4*    Dg Chest 2 View  Result Date: 06/22/2016 CLINICAL DATA:  Substernal chest pain, shortness of breath, syncope EXAM: CHEST  2 VIEW COMPARISON:  None. FINDINGS: Moderate to large right pleural effusion. Associated right lower lobe opacity, likely atelectasis. Left lung is clear.  No pneumothorax. The heart is normal in size.  Left subclavian ICD. Degenerative changes of the visualized thoracolumbar spine. IMPRESSION: Moderate to large right  pleural effusion. Associated right lower lobe opacity, likely atelectasis. Electronically Signed   By: Julian Hy M.D.   On: 06/22/2016 13:54   Ct Head Wo Contrast  Result Date: 06/22/2016 CLINICAL DATA:  Acute mental status and low blood pressure. EXAM: CT HEAD WITHOUT CONTRAST TECHNIQUE: Contiguous axial images were obtained from the base of the skull through the vertex without intravenous contrast. COMPARISON:  None. FINDINGS: Brain: No subdural, epidural, or subarachnoid hemorrhage. Increased CSF near the left temporal horn, likely in an arachnoid cyst. Ventricles and sulci are prominent, likely due to atrophy. The cerebellum, brainstem, and basal cisterns are within normal limits. Scattered white matter changes most focal in the right frontal lobe on series 2, image 21. No acute cortical ischemia or infarct. No mass, mass effect, or midline shift. Vascular: Atherosclerotic changes seen  in the intracranial carotid arteries. Skull: Normal. Negative for fracture or focal lesion. Sinuses/Orbits: No acute finding. Other: None. IMPRESSION: 1. No acute abnormality identified. Electronically Signed   By: Dorise Bullion III M.D   On: 06/22/2016 18:50     [place] 06/22/2016, 3:21 PM PGY-1, [place] Intern pager: [phone], text pages welcome   UPPER LEVEL ADDENDUM  I have read the above note and made revisions highlighted in blue.  Kerrin Mo, MD, PGY-2 [facility]

## 2016-06-22 NOTE — ED Triage Notes (Signed)
Pt in from [place] via [facility] with c/o substernal sharp 7/10 CP of unknown start date. Pt denies nausea, dizziness or sob. Pt is a&ox2, hx of GERD and MI. Per EMS, initial BP was 106/60. Pt was given 1NTG and '324mg'$  ASA, BP dropped to 74/45. Given 137m's NS and re-check BP 83/46.

## 2016-06-23 ENCOUNTER — Observation Stay (HOSPITAL_COMMUNITY): Payer: Medicare Other

## 2016-06-23 DIAGNOSIS — R079 Chest pain, unspecified: Secondary | ICD-10-CM

## 2016-06-23 LAB — CBC
HEMATOCRIT: 32.8 % — AB (ref 39.0–52.0)
HEMOGLOBIN: 10.5 g/dL — AB (ref 13.0–17.0)
MCH: 27.9 pg (ref 26.0–34.0)
MCHC: 32 g/dL (ref 30.0–36.0)
MCV: 87 fL (ref 78.0–100.0)
Platelets: 273 10*3/uL (ref 150–400)
RBC: 3.77 MIL/uL — AB (ref 4.22–5.81)
RDW: 13.2 % (ref 11.5–15.5)
WBC: 8.3 10*3/uL (ref 4.0–10.5)

## 2016-06-23 LAB — BASIC METABOLIC PANEL
Anion gap: 9 (ref 5–15)
BUN: 17 mg/dL (ref 6–20)
CHLORIDE: 102 mmol/L (ref 101–111)
CO2: 26 mmol/L (ref 22–32)
CREATININE: 0.92 mg/dL (ref 0.61–1.24)
Calcium: 8.4 mg/dL — ABNORMAL LOW (ref 8.9–10.3)
GFR calc Af Amer: >60 mL/min (ref >60)
GFR calc non Af Amer: >60 mL/min (ref >60)
GLUCOSE: 171 mg/dL — AB (ref 65–99)
POTASSIUM: 3.9 mmol/L (ref 3.5–5.1)
Sodium: 137 mmol/L (ref 135–145)

## 2016-06-23 LAB — TROPONIN I

## 2016-06-23 LAB — MRSA PCR SCREENING: MRSA BY PCR: NEGATIVE

## 2016-06-23 LAB — BRAIN NATRIURETIC PEPTIDE: B Natriuretic Peptide: 176 pg/mL — ABNORMAL HIGH (ref 0.0–100.0)

## 2016-06-23 MED ORDER — IOPAMIDOL (ISOVUE-300) INJECTION 61%
INTRAVENOUS | Status: AC
  Administered 2016-06-23: 75 mL
  Filled 2016-06-23: qty 75

## 2016-06-23 MED ORDER — NITROGLYCERIN 0.4 MG SL SUBL
0.4000 mg | SUBLINGUAL_TABLET | SUBLINGUAL | Status: DC | PRN
  Administered 2016-06-23: 0.4 mg via SUBLINGUAL

## 2016-06-23 MED ORDER — NITROGLYCERIN 0.4 MG SL SUBL
SUBLINGUAL_TABLET | SUBLINGUAL | Status: DC
Start: 2016-06-23 — End: 2016-06-23
  Filled 2016-06-23: qty 1

## 2016-06-23 MED ORDER — HALOPERIDOL 1 MG PO TABS
1.0000 mg | ORAL_TABLET | Freq: Once | ORAL | Status: AC
  Administered 2016-06-23: 1 mg via ORAL
  Filled 2016-06-23: qty 1

## 2016-06-23 NOTE — Progress Notes (Addendum)
Pt arrived to unit.  Pt placed on telemetry by day shift nurse.  Verified with CCMD that they had received notice of Pt arrival x 2.  CCMD confirmed.  Pt daughter at bedside at this time.  Discussed Pt plan of care.  Daughter indicates understanding.  Daughter states family will be back tomorrow around lunch time.  Pt denies chest pain at this time.  No acute distress.  Will cont to monitor.

## 2016-06-23 NOTE — Progress Notes (Signed)
Pt found with IV removed.  No bleeding noted.  Notified attending of self-removed IV.  Pt continues to be very confused.  Will not attempt to replace IV as no medications being administered via IV at this time.  Pt is DNR.  Will cont to monitor.

## 2016-06-23 NOTE — Progress Notes (Signed)
Pt called out.  This nurse went to Pt's room.  Pt breathing heavy.  Pt pointing to chest, stated it felt heavy.  This nurse asked if Pt had pain in that area.  Pt stated yes.  Call placed to attending.  Received verbal order for nitro tab x 1 now and EKG 12 lead.  Will cont to monitor.

## 2016-06-23 NOTE — Evaluation (Signed)
Physical Therapy Evaluation Patient Details Name: Erik Alvarez MRN: [id number] DOB: [birth year] Today's Date: 06/23/2016   History of Present Illness  Pt adm with chest pain. PMH - dementia, urinary retention with foley, HTN, CAD, Pacer  Clinical Impression  Pt admitted with above diagnosis and presents to PT with functional limitations due to deficits listed below (See PT problem list). Pt needs skilled PT to maximize independence and safety to allow discharge to home to independent living with wife and hired caregivers. Daughter aware that pt may continue to need incr support and eventually may need a different setting.     Follow Up Recommendations Home health PT;Supervision/Assistance - 24 hour    Equipment Recommendations  None recommended by PT    Recommendations for Other Services       Precautions / Restrictions Precautions Precautions: Fall Restrictions Weight Bearing Restrictions: No      Mobility  Bed Mobility Overal bed mobility: Needs Assistance Bed Mobility: Supine to Sit     Supine to sit: Min assist     General bed mobility comments: Assist to elevate trunk from bed  Transfers Overall transfer level: Needs assistance Equipment used: Rolling walker (2 wheeled) Transfers: Sit to/from Stand Sit to Stand: Min assist;Mod assist         General transfer comment: Assist to bring hips up and for balance. Min A from bed and mod A from low commode  Ambulation/Gait Ambulation/Gait assistance: Min assist Ambulation Distance (Feet): 20 Feet (20' x 1, 15' x 1) Assistive device: Rolling walker (2 wheeled) Gait Pattern/deviations: Step-through pattern;Decreased step length - right;Decreased step length - left;Shuffle;Trunk flexed   Gait velocity interpretation: <1.8 ft/sec, indicative of risk for recurrent falls General Gait Details: Assist for balance and support. Verbal cues to keep feet inside base of walker  Stairs            Wheelchair  Mobility    Modified Rankin (Stroke Patients Only)       Balance Overall balance assessment: Needs assistance Sitting-balance support: Bilateral upper extremity supported Sitting balance-Leahy Scale: Poor Sitting balance - Comments: UE support Postural control: Posterior lean Standing balance support: Bilateral upper extremity supported Standing balance-Leahy Scale: Poor Standing balance comment: walker and min A for static standing                             Pertinent Vitals/Pain Pain Assessment: Faces Faces Pain Scale: No hurt    Home Living Family/patient expects to be discharged to:: Other (Comment) (Independent apt at [facility]) Living Arrangements: Spouse/significant other Available Help at Discharge: Family;Personal care attendant;Available 24 hours/day Type of Home: Independent living facility Home Access: Level entry;Elevator     Home Layout: One level Home Equipment: Walker - 2 wheels Additional Comments: Pt has hired caregivers as well during a good part of the day    Prior Function Level of Independence: Needs assistance   Gait / Transfers Assistance Needed: Amb with rolling walker           Hand Dominance        Extremity/Trunk Assessment   Upper Extremity Assessment: Defer to OT evaluation           Lower Extremity Assessment: Generalized weakness         Communication   Communication: HOH  Cognition Arousal/Alertness: Awake/alert Behavior During Therapy: Flat affect Overall Cognitive Status: History of cognitive impairments - at baseline  General Comments      Exercises     Assessment/Plan    PT Assessment Patient needs continued PT services  PT Problem List Decreased strength;Decreased activity tolerance;Decreased balance;Decreased mobility;Decreased cognition;Decreased knowledge of use of DME;Decreased safety awareness          PT Treatment Interventions DME  instruction;Gait training;Functional mobility training;Therapeutic activities;Therapeutic exercise;Balance training;Patient/family education    PT Goals (Current goals can be found in the Care Plan section)  Acute Rehab PT Goals Patient Stated Goal: Not stated PT Goal Formulation: With family Time For Goal Achievement: 06/30/16 Potential to Achieve Goals: Fair    Frequency Min 3X/week   Barriers to discharge Decreased caregiver support wife elderly    Co-evaluation               End of Session Equipment Utilized During Treatment: Gait belt Activity Tolerance: Patient limited by fatigue Patient left: in chair;with call bell/phone within reach;with chair alarm set Nurse Communication: Mobility status    Functional Assessment Tool Used: clinical judgement Functional Limitation: Mobility: Walking and moving around Mobility: Walking and Moving Around Current Status [phone]): At least 20 percent but less than 40 percent impaired, limited or restricted Mobility: Walking and Moving Around Goal Status [phone]): At least 1 percent but less than 20 percent impaired, limited or restricted    Time: 6712-4580 PT Time Calculation (min) (ACUTE ONLY): 29 min   Charges:   PT Evaluation $PT Eval Moderate Complexity: 1 Procedure PT Treatments $Gait Training: 8-22 mins   PT G Codes:   PT G-Codes **NOT FOR INPATIENT CLASS** Functional Assessment Tool Used: clinical judgement Functional Limitation: Mobility: Walking and moving around Mobility: Walking and Moving Around Current Status ([id number]): At least 20 percent but less than 40 percent impaired, limited or restricted Mobility: Walking and Moving Around Goal Status [phone]): At least 1 percent but less than 20 percent impaired, limited or restricted    [facility] 06/23/2016, 4:01 PM [facility] PT [phone]

## 2016-06-23 NOTE — Consult Note (Addendum)
CARDIOLOGY CONSULT NOTE     Primary Care Physician: Reymundo Poll, MD Referring Physician:  Admit Date: 06/22/2016  Reason for consultation:  Erik Alvarez is a 82 y.o. male with a h/o CAD, syncope and inducible VT s/p ICD presented to the ER with chest pain.  Patient had pain in his left shoulder. Per the family he had also been complaining of right sided discomfort. History is limited by patients dementia.  Currently, feels tired but otherwise without complaint.    Today, he denies symptoms of palpitations, chest pain, shortness of breath, orthopnea, PND, lower extremity edema, dizziness, presyncope, syncope, or neurologic sequela. The patient is tolerating medications without difficulties and is otherwise without complaint today.   Past Medical History:  Diagnosis Date  . Chest pain   . Coronary artery disease   . Dementia   . Dizziness   . Hypertension   . SOB (shortness of breath)   . Syncope   . VT (ventricular tachycardia) ([place])    Past Surgical History:  Procedure Laterality Date  . INSERT / REPLACE / REMOVE PACEMAKER      . aspirin  324 mg Oral Once  . aspirin EC  81 mg Oral Daily  . carvedilol  12.5 mg Oral BID WC  . donepezil  10 mg Oral QHS  . enoxaparin (LOVENOX) injection  40 mg Subcutaneous Q24H  . memantine  2.5 mg Oral BID  . simvastatin  40 mg Oral q1800      No Known Allergies  Social History   Social History  . Marital status: Married    Spouse name: N/A  . Number of children: N/A  . Years of education: N/A   Occupational History  . Not on file.   Social History Main Topics  . Smoking status: Former Research scientist (life sciences)  . Smokeless tobacco: Never Used  . Alcohol use Yes  . Drug use: No  . Sexual activity: Not on file   Other Topics Concern  . Not on file   Social History Narrative  . No narrative on file    Family History  Problem Relation Age of Onset  . Diabetes Brother     ROS- All systems are reviewed and negative except as per the  HPI above  Physical Exam: Telemetry: Vitals:   06/22/16 1715 06/22/16 1832 06/22/16 2307 06/23/16 0359  BP: 122/78 (!) 142/53 128/65 (!) 137/57  Pulse: 64 60 (!) 59 62  Resp:  18 18 (!) 24  Temp:  97.4 F (36.3 C) 97.7 F (36.5 C) 97.9 F (36.6 C)  TempSrc:  Oral Oral Axillary  SpO2: 93% 95% 90% 92%  Weight:  140 lb 10.5 oz (63.8 kg)    Height:        GEN- The patient is well appearing, alert and oriented x 3 today.   Head- normocephalic, atraumatic Eyes-  Sclera clear, conjunctiva pink Ears- hearing intact Oropharynx- clear Neck- supple, no JVP Lymph- no cervical lymphadenopathy Lungs- Clear to ausculation bilaterally, normal work of breathing Heart- Regular rate and rhythm, no murmurs, rubs or gallops, PMI not laterally displaced GI- soft, NT, ND, + BS Extremities- no clubbing, cyanosis, or edema MS- no significant deformity or atrophy Skin- no rash or lesion Psych- euthymic mood, full affect Neuro- strength and sensation are intact  EKG: sinus rhythm, old inferior MI  Telemetry: intermittent A pacing  Labs:   Lab Results  Component Value Date   WBC 8.3 06/23/2016   HGB 10.5 (L) 06/23/2016   HCT  32.8 (L) 06/23/2016   MCV 87.0 06/23/2016   PLT 273 06/23/2016    Recent Labs Lab 06/23/16 0731  NA 137  K 3.9  CL 102  CO2 26  BUN 17  CREATININE 0.92  CALCIUM 8.4*  GLUCOSE 171*   Lab Results  Component Value Date   TROPONINI <0.03 06/23/2016   No results found for: CHOL No results found for: HDL No results found for: LDLCALC No results found for: TRIG No results found for: CHOLHDL No results found for: LDLDIRECT    Radiology:  Moderate to large right pleural effusion.  Associated right lower lobe opacity, likely atelectasis.  Echo: pending  ASSESSMENT AND PLAN:  1. Chest pain: Patient reported chest pain to daughter but none today.  ECG without acute changes, troponin negative thus far.  Would continue to monitor.  2. ICD at EOL: plan for  generator change scheduled for 06/27/16. Patient and family have decided on downgrade to pacemaker. ICD interrogation without abnormalities.  No arrhythmias seen. Stable lead parameters.  3. Plural effusion: workup per primary team  Crayton Savarese Meredith Leeds, MD 06/23/2016  10:26 AM

## 2016-06-23 NOTE — Progress Notes (Signed)
Pt s/p nitro tab x 1 and EKG 12 lead.  Pt appears more comfortable, breathing has improved.  Pt denies chest pain.  Attending has assessed.  Will cont to closely monitor.

## 2016-06-23 NOTE — Progress Notes (Signed)
Family Medicine Teaching Service Daily Progress Note Intern Pager: [phone]  Patient name: Erik Alvarez [facility] Medical record number: [id number] Date of birth: September 05, 1929 Age: 82 y.o. Gender: male  Primary Care Provider: Reymundo Poll, MD Consultants: Cardiology  Code Status: DNR   Pt Overview and Major Events to Date:  10/28: Patient admitted for ACS r/o   Assessment and Plan: [name] is a 82 y.o. male with a past medical history significant for CAD (s/p stent of RCA 2010), Dementia, HTN, VT(Pacemaker) presented with substernal chest pain radiating to right chest then left shoulder concerning for ACS in the setting of a history of CAD and hypertension.  #Chest Pain, acute  Per daughter substernal chest pain/possibly radiating to left shoulder. Symptoms description concerning for angina. Patient has a Heart score of 4.  Hx of CAD and HTN indicative of elevated risk factors. Troponin 0.04 was very mildly elevated and then subsequently negative x3. EKG findings consistent with prior inferior infarct, no significant ST elevation/depression. Similar to previous EKG on 9/18.  Due to hx of CAD, HTN, will workup patient for ACS rule out. Pain could be secondary to referred pain from pulmonary effusion or could also be from MSK etiology with recent history of fall though not reproducible with palpation of the chest wall and rib cage. At this juncture, our leading diagnosis would be ACS event vs pulmonary process vs MSK etiology. --Admit to FMTS telemetry, admitting physician Dr. McDiarmid  --AM EKG pending  --Continue simvastatin 40 mg daily --Continue 81 mg aspirin daily --Echo pending  --consult cardiology for pacemaker interrogation  --Tylenol 650g q4 prn --Zofran 4 mg q6 prn  # Cognitive Decline, Dementia, chronic Patient has history of dementia, previously able to perform most ADLs. Patient patient recently moved to [facility] retirement home in September. Patient is able to perform most  ADLs on his own on a daily basis. However patient had a recent fall about 3 weeks ago and since that fall has had a decline in ability to take care of himself. Patient was seen in the ED for the fall and received workup including CBC, BMET, urine. There was no head imaging at that time in the absence of head trauma an/or LOC at that time.  Per family, patient with increasing instability in gait as well. CT head was negative for an acute abnormality. Other possibilities include arrhthymias though pacemaker in place, vs orthostatic hypotension.  -- Interrogation of pacemaker  -- PT/OT eval and treat --Continue memantine 2.5 mg twice a day --Continue donepezil 10 mg daily at bedtime  #Large to moderate pleural effusion, acute Chest x-ray showed moderate to large pleural effusion on the right side,though no other signs of fluid overload on physical exam. Last ECHO noted in  2012 with EF of 50%.. Patient with incidental finding on CT in September 2017 of 1.2 cm left lower lobe nodule. Imaging also showed hilar lymphadenopathy suspicious for malignancy. With this new finding on chest x-ray and further workup etiology of pulmonary effusion for malignancy vs right heart failure should be consider. BNP elevated to 176.  -- Echo pending  -- Chest CT for further characterization, may need to follow up with pulmonary/IR for thoracentesis depending on results  --Monitor vital signs and respiration status   #Fall, acute Patient has had witnessed and possible unwitnessed falls since moving to his retirement home.These falls event could be related to new environment and unfamiliarity with new settings. Could also be secondary to physiologic decline. However, the way these  falls have been described by family members, they are more consistent with syncopal episode. Our differential diagnosis for her syncopal episode would be vasovagal vs orthostatic vs arrhythmias. Patient had recently placed pacemaker which could be  contributing to these events as well. --Order [employer] vitals  --Consult cardiology, discuss interrogate pacemaker  --PT/OT consult   #Urinary retention:  Patient with hx of urinary retention due to BPH, currently with catheter in placed. Followed by urology, plan for Urolift. --Will keep foley in -- Outpatient follow up with urology   #Hx of CAD ( s/p stent of RCA (2010) --Continue aspirin 81 mg daily  #Hyperlipidemia --Continue simvastatin 40 mg daily  FEN/GI: Heart healthy Diet  Prophylaxis: Lovenox    Disposition: Pending cardiology recs and further imaging results   Subjective:  "Not sure" of how he is feeling. Does report mild pain throughout both sides of his chest and radiating into his abdomen.   Objective: Temp:  [97.4 F (36.3 C)-97.9 F (36.6 C)] 97.9 F (36.6 C) (10/29 0359) Pulse Rate:  [53-74] 62 (10/29 0359) Resp:  [18-26] 24 (10/29 0359) BP: (96-142)/(53-99) 137/57 (10/29 0359) SpO2:  [86 %-96 %] 92 % (10/29 0359) Weight:  [140 lb 10.5 oz (63.8 kg)-147 lb (66.7 kg)] 140 lb 10.5 oz (63.8 kg) (10/28 1832) Physical Exam: General: NAD, confused, cooperative  Cardiovascular: RRR. No murmurs appreciated. No pain to palpation of chest wall.  Respiratory: CTAB. Normal WOB.  Abdomen: soft, NTND  Extremities: Warm to touch. No LE edema.   Laboratory:  Recent Labs Lab 06/19/16 1042 06/22/16 1300  WBC 8.6 9.1  HGB 11.1* 10.2*  HCT 32.7* 30.9*  PLT 308 274    Recent Labs Lab 06/19/16 1042 06/22/16 1300  NA 138 138  K 4.7 4.1  CL 102 106  CO2 24 22  BUN 24 25*  CREATININE 1.30* 1.21  CALCIUM 8.8 8.4*  GLUCOSE 211* 219*    BNP 176  Troponin 0.04 then negative x3   Imaging/Diagnostic Tests: Dg Chest 2 View  Result Date: 06/22/2016 CLINICAL DATA:  Substernal chest pain, shortness of breath, syncope EXAM: CHEST  2 VIEW COMPARISON:  None. FINDINGS: Moderate to large right pleural effusion. Associated right lower lobe opacity, likely  atelectasis. Left lung is clear.  No pneumothorax. The heart is normal in size.  Left subclavian ICD. Degenerative changes of the visualized thoracolumbar spine. IMPRESSION: Moderate to large right pleural effusion. Associated right lower lobe opacity, likely atelectasis. Electronically Signed   By: Julian Hy M.D.   On: 06/22/2016 13:54    Ct Head Wo Contrast  Result Date: 06/22/2016 CLINICAL DATA:  Acute mental status and low blood pressure. EXAM: CT HEAD WITHOUT CONTRAST TECHNIQUE: Contiguous axial images were obtained from the base of the skull through the vertex without intravenous contrast. COMPARISON:  None. FINDINGS: Brain: No subdural, epidural, or subarachnoid hemorrhage. Increased CSF near the left temporal horn, likely in an arachnoid cyst. Ventricles and sulci are prominent, likely due to atrophy. The cerebellum, brainstem, and basal cisterns are within normal limits. Scattered white matter changes most focal in the right frontal lobe on series 2, image 21. No acute cortical ischemia or infarct. No mass, mass effect, or midline shift. Vascular: Atherosclerotic changes seen in the intracranial carotid arteries. Skull: Normal. Negative for fracture or focal lesion. Sinuses/Orbits: No acute finding. Other: None. IMPRESSION: 1. No acute abnormality identified. Electronically Signed   By: Dorise Bullion III M.D   On: 06/22/2016 18:50    Nicolette Bang,  DO 06/23/2016, 8:49 AM PGY-2, [place] Intern pager: [phone], text pages welcome

## 2016-06-24 ENCOUNTER — Observation Stay (HOSPITAL_BASED_OUTPATIENT_CLINIC_OR_DEPARTMENT_OTHER): Payer: Medicare Other

## 2016-06-24 ENCOUNTER — Observation Stay (HOSPITAL_COMMUNITY): Payer: Medicare Other

## 2016-06-24 DIAGNOSIS — Z955 Presence of coronary angioplasty implant and graft: Secondary | ICD-10-CM | POA: Diagnosis not present

## 2016-06-24 DIAGNOSIS — J9601 Acute respiratory failure with hypoxia: Secondary | ICD-10-CM | POA: Diagnosis not present

## 2016-06-24 DIAGNOSIS — R41841 Cognitive communication deficit: Secondary | ICD-10-CM | POA: Diagnosis present

## 2016-06-24 DIAGNOSIS — Z79899 Other long term (current) drug therapy: Secondary | ICD-10-CM | POA: Diagnosis not present

## 2016-06-24 DIAGNOSIS — F0280 Dementia in other diseases classified elsewhere without behavioral disturbance: Secondary | ICD-10-CM | POA: Diagnosis not present

## 2016-06-24 DIAGNOSIS — C459 Mesothelioma, unspecified: Secondary | ICD-10-CM | POA: Diagnosis present

## 2016-06-24 DIAGNOSIS — I255 Ischemic cardiomyopathy: Secondary | ICD-10-CM | POA: Diagnosis present

## 2016-06-24 DIAGNOSIS — I10 Essential (primary) hypertension: Secondary | ICD-10-CM | POA: Diagnosis present

## 2016-06-24 DIAGNOSIS — Z66 Do not resuscitate: Secondary | ICD-10-CM | POA: Diagnosis present

## 2016-06-24 DIAGNOSIS — Z87891 Personal history of nicotine dependence: Secondary | ICD-10-CM | POA: Diagnosis not present

## 2016-06-24 DIAGNOSIS — W19XXXA Unspecified fall, initial encounter: Secondary | ICD-10-CM | POA: Diagnosis present

## 2016-06-24 DIAGNOSIS — J90 Pleural effusion, not elsewhere classified: Secondary | ICD-10-CM | POA: Diagnosis not present

## 2016-06-24 DIAGNOSIS — F0390 Unspecified dementia without behavioral disturbance: Secondary | ICD-10-CM | POA: Diagnosis present

## 2016-06-24 DIAGNOSIS — C3490 Malignant neoplasm of unspecified part of unspecified bronchus or lung: Secondary | ICD-10-CM | POA: Diagnosis present

## 2016-06-24 DIAGNOSIS — N401 Enlarged prostate with lower urinary tract symptoms: Secondary | ICD-10-CM | POA: Diagnosis present

## 2016-06-24 DIAGNOSIS — C782 Secondary malignant neoplasm of pleura: Secondary | ICD-10-CM | POA: Diagnosis present

## 2016-06-24 DIAGNOSIS — I252 Old myocardial infarction: Secondary | ICD-10-CM | POA: Diagnosis not present

## 2016-06-24 DIAGNOSIS — E785 Hyperlipidemia, unspecified: Secondary | ICD-10-CM | POA: Diagnosis present

## 2016-06-24 DIAGNOSIS — J910 Malignant pleural effusion: Secondary | ICD-10-CM | POA: Diagnosis present

## 2016-06-24 DIAGNOSIS — R4189 Other symptoms and signs involving cognitive functions and awareness: Secondary | ICD-10-CM

## 2016-06-24 DIAGNOSIS — J9811 Atelectasis: Secondary | ICD-10-CM | POA: Diagnosis present

## 2016-06-24 DIAGNOSIS — G309 Alzheimer's disease, unspecified: Secondary | ICD-10-CM | POA: Diagnosis not present

## 2016-06-24 DIAGNOSIS — Z79891 Long term (current) use of opiate analgesic: Secondary | ICD-10-CM | POA: Diagnosis not present

## 2016-06-24 DIAGNOSIS — J918 Pleural effusion in other conditions classified elsewhere: Secondary | ICD-10-CM | POA: Diagnosis not present

## 2016-06-24 DIAGNOSIS — I313 Pericardial effusion (noninflammatory): Secondary | ICD-10-CM | POA: Diagnosis present

## 2016-06-24 DIAGNOSIS — Z95810 Presence of automatic (implantable) cardiac defibrillator: Secondary | ICD-10-CM | POA: Diagnosis not present

## 2016-06-24 DIAGNOSIS — I2510 Atherosclerotic heart disease of native coronary artery without angina pectoris: Secondary | ICD-10-CM | POA: Diagnosis present

## 2016-06-24 DIAGNOSIS — Z7982 Long term (current) use of aspirin: Secondary | ICD-10-CM | POA: Diagnosis not present

## 2016-06-24 DIAGNOSIS — I249 Acute ischemic heart disease, unspecified: Secondary | ICD-10-CM | POA: Diagnosis present

## 2016-06-24 DIAGNOSIS — Y92099 Unspecified place in other non-institutional residence as the place of occurrence of the external cause: Secondary | ICD-10-CM | POA: Diagnosis not present

## 2016-06-24 DIAGNOSIS — R338 Other retention of urine: Secondary | ICD-10-CM | POA: Diagnosis present

## 2016-06-24 DIAGNOSIS — Z515 Encounter for palliative care: Secondary | ICD-10-CM | POA: Diagnosis present

## 2016-06-24 DIAGNOSIS — R079 Chest pain, unspecified: Secondary | ICD-10-CM | POA: Diagnosis not present

## 2016-06-24 LAB — BASIC METABOLIC PANEL
ANION GAP: 9 (ref 5–15)
BUN: 18 mg/dL (ref 6–20)
CHLORIDE: 103 mmol/L (ref 101–111)
CO2: 24 mmol/L (ref 22–32)
Calcium: 8.5 mg/dL — ABNORMAL LOW (ref 8.9–10.3)
Creatinine, Ser: 0.93 mg/dL (ref 0.61–1.24)
GFR calc Af Amer: >60 mL/min (ref >60)
GFR calc non Af Amer: >60 mL/min (ref >60)
GLUCOSE: 157 mg/dL — AB (ref 65–99)
POTASSIUM: 4.1 mmol/L (ref 3.5–5.1)
Sodium: 136 mmol/L (ref 135–145)

## 2016-06-24 LAB — BODY FLUID CELL COUNT WITH DIFFERENTIAL
LYMPHS FL: 90 %
Monocyte-Macrophage-Serous Fluid: 4 % — ABNORMAL LOW (ref 50–90)
NEUTROPHIL FLUID: 6 % (ref 0–25)
WBC FLUID: 464 uL (ref 0–1000)

## 2016-06-24 LAB — CBC
HEMATOCRIT: 33.4 % — AB (ref 39.0–52.0)
Hemoglobin: 11.2 g/dL — ABNORMAL LOW (ref 13.0–17.0)
MCH: 28.6 pg (ref 26.0–34.0)
MCHC: 33.5 g/dL (ref 30.0–36.0)
MCV: 85.4 fL (ref 78.0–100.0)
Platelets: 297 10*3/uL (ref 150–400)
RBC: 3.91 MIL/uL — AB (ref 4.22–5.81)
RDW: 13.2 % (ref 11.5–15.5)
WBC: 8.8 10*3/uL (ref 4.0–10.5)

## 2016-06-24 LAB — ECHOCARDIOGRAM COMPLETE
Height: 70 in
Weight: 2250.46 oz

## 2016-06-24 LAB — PROTEIN, TOTAL: Total Protein: 6.1 g/dL — ABNORMAL LOW (ref 6.5–8.1)

## 2016-06-24 LAB — PROTEIN, BODY FLUID: TOTAL PROTEIN, FLUID: 3.6 g/dL

## 2016-06-24 LAB — CHOLESTEROL, TOTAL: CHOLESTEROL: 141 mg/dL (ref 0–200)

## 2016-06-24 LAB — GRAM STAIN

## 2016-06-24 LAB — LACTATE DEHYDROGENASE
LDH: 213 U/L — ABNORMAL HIGH (ref 98–192)
LDH: 221 U/L — ABNORMAL HIGH (ref 98–192)

## 2016-06-24 LAB — TROPONIN I: Troponin I: 0.04 ng/mL (ref <0.03)

## 2016-06-24 LAB — LACTATE DEHYDROGENASE, PLEURAL OR PERITONEAL FLUID: LD FL: 2664 U/L — AB (ref 3–23)

## 2016-06-24 LAB — ALBUMIN: ALBUMIN: 2.2 g/dL — AB (ref 3.5–5.0)

## 2016-06-24 MED ORDER — KETOROLAC TROMETHAMINE 15 MG/ML IJ SOLN
15.0000 mg | Freq: Once | INTRAMUSCULAR | Status: AC
  Administered 2016-06-24: 15 mg via INTRAVENOUS
  Filled 2016-06-24: qty 1

## 2016-06-24 NOTE — Clinical Social Work Note (Signed)
CSW spoke with patient and patient's daughter Baker Janus, [phone]) at bedside. Patient's daughter is healthcare POA. Patient previously lived at [facility] (independent Living) prior to admission. CSW met with patient to understand if patient would return when medically stable to discharge. Patient's daughter inquiring about the options for the patient to discharge to her mother, his wife's apartment and live with his wife after discharge--patient is agreeable and prefers this plan. Patient's daughter inquiring about in home nurse and in home PT. Patient's daughter verbalized understanding of having to pay for services in the home if insurance will not cover it. CSW spoke with CM. CM will meet with patient and patient's daughter to discuss options for home after discharge. CSW signing off at this time.  [address] [place]

## 2016-06-24 NOTE — Progress Notes (Signed)
FPTS Interim Progress Note  (Late entry)  S: Paged by nurse to evaluate patient. He reportedly was calling out asking for help and was breathing heavy and complained of heavy chest pain.  Was in ED at this time evaluating another patient and gave verbal order for nitro x1 and EKG.  Upon my arrival to the floor patient denied CP, SOB or palpitations.   O: BP 131/69 (BP Location: Right Arm)   Pulse 62   Temp 98 F (36.7 C) (Oral)   Resp 20   Ht '5\' 10"'$  (1.778 m)   Wt 140 lb 10.5 oz (63.8 kg)   SpO2 90%   BMI 20.18 kg/m   General: 82yo M lying in bed appearing mildly uncomfortable, but in NAD Cardiac: RRR, no murmurs, no tenderness to palpation Resp: CTABL Abd: soft, NTND Ext: no edema  A/P: Chest pain:  Received nitro x1 with some improvement. EKG unchanged from prior.  VSS.   Likely that his chest pain was 2/2 SOB from large pleural effusion.  - trend troponins - repeat EKG - close monitoring  Eloise Levels, MD 06/24/2016, 2:35 AM PGY-1, [place] [facility] pager [phone]

## 2016-06-24 NOTE — Care Management Note (Signed)
Case Management Note Marvetta Gibbons RN, BSN Unit 2W-Case Manager [phone]  Patient Details  Name: Erik Alvarez MRN: [id number] Date of Birth: [birth year]  Subjective/Objective:  Pt admitted with chest pain/pl efussion                   Action/Plan: PTA pt lived at home with spouse at [place] apartment at [facility]- spoke with pt and daughter at bedside- per conversation plan would be to return to pt's living situation at [facility] where he lives with wife in an apartment- pt has caregivers (private duty) 12 hrs/day- and also has [place] with [facility] along with inhouse therapy with [facility] per daughter- at this time plan would be to continue with the current set up- unless a high level of care is recommended- daughter would consider STSNF if needed. - list provided to daughter of private duty agencies for reference per request. CM to follow for any further d/c needs.   Expected Discharge Date:                 Expected Discharge Plan:  [place]  In-House Referral:  Clinical Social Work  Discharge planning Services  CM Consult  Post Acute Care Choice:  Home Health, Resumption of Svcs/PTA Provider Choice offered to:  Patient, Adult Children  DME Arranged:    DME Agency:     HH Arranged:    [place]:  [place] (now [facility])  Status of Service:  In process, will continue to follow  If discussed at Long Length of Stay Meetings, dates discussed:    Additional Comments:  Dawayne Patricia, RN 06/24/2016, 11:06 AM

## 2016-06-24 NOTE — Progress Notes (Signed)
Family Medicine Teaching Service Daily Progress Note Intern Pager: [phone]  Patient name: Erik Alvarez: [id number] Date of birth: Jan 01, 1930 Age: 82 y.o. Gender: male  Primary Care Provider: Reymundo Poll, MD Consultants: Cardiology  Code Status: DNR   Pt Overview and Major Events to Date:  10/28: Patient admitted for ACS r/o   Assessment and Plan: [place] GROSSER is a 82 y.o. male with a past medical history significant for CAD (s/p stent of RCA 2010), Dementia, HTN, VT(Pacemaker) presented with substernal chest pain radiating to right chest then left shoulder concerning for ACS in the setting of a history of CAD and hypertension.  #Chest Pain, acute  Patient continue to complaint of chest pressure/pain. CT chest show large pleural effusion  Causing mediastinal shift to the left an small pericardial effusion. Repeat troponin on 10/29 secondary to pain complaints was 0.04 mildly above normal. EKG was unchanged from prior reads. Most likely related to large pleural effusion and pericardial effusion. Pacemaker interrogation was unremarkable and pt has generator change schedule for 06/27/16. --Follow up on Echo  --Consult PCCM, patient will probably need thoracentesis --Continue simvastatin 40 mg daily --Continue 81 mg aspirin daily  --Tylenol 650g q4 prn --Zofran 4 mg q6 prn  # Cognitive Decline, Dementia, chronic Patient has history of dementia, previously able to perform most ADLs. Patient patient recently moved to [facility] retirement home in September. Patient is able to perform most ADLs on his own on a daily basis. However patient had a recent fall about 3 weeks ago and since that fall has had a decline in ability to take care of himself.   --Continue memantine 2.5 mg twice a day --Discontinue donepezil 10 mg daily at bedtime  #Large to moderate pleural effusion, acute Chest x-ray on 10/29 show large pleural effusion on the right side causing mediastinal  shift of the heart to the left, small pericardial effusion and multiple lung nodules suscipious for malignancy. There was also a left adrenal mass most likely adenoma. Given presentation patient will need further work up of effusion as well as relief from mediastinal pressure. --Consult PCCM this am --Need thoracenthesis IR guided or PCCM --Pleural fluid analysis ( cytology, gram stain, LDH, protein...) most likely secondary to malignancy  --Monitor vital signs and respiration status   #Fall, acute Patient has had witnessed and possible unwitnessed falls since moving to his retirement home.These falls event could be related to new environment and unfamiliarity with new settings. Could also be secondary to physiologic decline. However, the way these falls have been described by family members, they are more consistent with syncopal episode. Our differential diagnosis for her syncopal episode would be vasovagal vs orthostatic vs arrhythmias.Pacemaker interrogation showed no arrhythmias and leads were stable. PT  Evaluation with recommendations for home health PT and 24hrsupervision. --Orthostatics vitals as needed   #Urinary retention:  Patient with hx of urinary retention due to BPH, currently with catheter in placed. Followed by urology, plan for Urolift. --Will keep foley in -- Outpatient follow up with urology   #Hx of CAD ( s/p stent of RCA (2010) --Continue aspirin 81 mg daily  #Hyperlipidemia --Continue simvastatin 40 mg daily  FEN/GI: Heart healthy Diet  Prophylaxis: Lovenox    Disposition: Pending cardiology recs and further imaging results   Subjective:  Patient appears to be complaining of chest pain and SOB, though difficult to assess due to patient dementia. Patient seems uncomfortable and frustrated but still cooperative.   Objective: Temp:  Reina.Alexander F (  36.7 C)-98.2 F (36.8 C)] 98.2 F (36.8 C) (10/30 0551) Pulse Rate:  [60-62] 62 (10/29 1947) Resp:  [20] 20 (10/30  0551) BP: (131-159)/(59-69) 154/59 (10/30 0551) SpO2:  [90 %-94 %] 94 % (10/30 0551) Physical Exam: General: NAD, confused, cooperative  Cardiovascular: RRR. No murmurs appreciated. No pain to palpation of chest wall.  Respiratory: CTAB. Normal WOB.  Abdomen: soft, NTND  Extremities: Warm to touch. No LE edema.   Laboratory:  Recent Labs Lab 06/22/16 1300 06/23/16 0731 06/24/16 0306  WBC 9.1 8.3 8.8  HGB 10.2* 10.5* 11.2*  HCT 30.9* 32.8* 33.4*  PLT 274 273 297    Recent Labs Lab 06/22/16 1300 06/23/16 0731 06/24/16 0306  NA 138 137 136  K 4.1 3.9 4.1  CL 106 102 103  CO2 '22 26 24  '$ BUN 25* 17 18  CREATININE 1.21 0.92 0.93  CALCIUM 8.4* 8.4* 8.5*  GLUCOSE 219* 171* 157*    BNP 176  Troponin 0.04   Imaging/Diagnostic Tests: Dg Chest 2 View  Result Date: 06/22/2016 CLINICAL DATA:  Substernal chest pain, shortness of breath, syncope EXAM: CHEST  2 VIEW COMPARISON:  None. FINDINGS: Moderate to large right pleural effusion. Associated right lower lobe opacity, likely atelectasis. Left lung is clear.  No pneumothorax. The heart is normal in size.  Left subclavian ICD. Degenerative changes of the visualized thoracolumbar spine. IMPRESSION: Moderate to large right pleural effusion. Associated right lower lobe opacity, likely atelectasis. Electronically Signed   By: Julian Hy M.D.   On: 06/22/2016 13:54    Ct Head Wo Contrast  Result Date: 06/22/2016 CLINICAL DATA:  Acute mental status and low blood pressure. EXAM: CT HEAD WITHOUT CONTRAST TECHNIQUE: Contiguous axial images were obtained from the base of the skull through the vertex without intravenous contrast. COMPARISON:  None. FINDINGS: Brain: No subdural, epidural, or subarachnoid hemorrhage. Increased CSF near the left temporal horn, likely in an arachnoid cyst. Ventricles and sulci are prominent, likely due to atrophy. The cerebellum, brainstem, and basal cisterns are within normal limits. Scattered white  matter changes most focal in the right frontal lobe on series 2, image 21. No acute cortical ischemia or infarct. No mass, mass effect, or midline shift. Vascular: Atherosclerotic changes seen in the intracranial carotid arteries. Skull: Normal. Negative for fracture or focal lesion. Sinuses/Orbits: No acute finding. Other: None. IMPRESSION: 1. No acute abnormality identified. Electronically Signed   By: Dorise Bullion III M.D   On: 06/22/2016 18:50   Ct Chest W Contrast  Result Date: 06/23/2016 CLINICAL DATA:  Moderate to large-sized right pleural effusion on chest radiographs yesterday. Substernal chest pain, shortness of breath and syncope. EXAM: CT CHEST WITH CONTRAST TECHNIQUE: Multidetector CT imaging of the chest was performed during intravenous contrast administration. CONTRAST:  1m ISOVUE-300 IOPAMIDOL (ISOVUE-300) INJECTION 61% COMPARISON:  Chest radiographs obtained yesterday. FINDINGS: Cardiovascular: Dense aortic atherosclerotic calcifications. Normal sized heart. Small pericardial effusion with a maximum thickness of 7 mm. There are multiple epicardial fat nodules anteriorly on the right. The largest measures 1.3 x 0.6 cm on image Alvarez 134 of series 3. Mediastinum/Nodes: The mediastinum and heart are shifted to the left by a large right pleural effusion. No enlarged lymph nodes. 3 mm right lobe thyroid nodule and 2 mm left lobe thyroid nodule on image Alvarez 20 of series 3. Lungs/Pleura: Large right pleural effusion with peripheral, irregular, non continuous and contiguous areas of pleural soft tissue thickening. There is also marked right lung atelectasis. Multiple left lung nodules are  demonstrated. The largest is in the left lower lobe, measuring 1.4 cm in maximum diameter on image Alvarez 110 of series 5. The next largest is in the left upper lobe, measuring 7 mm in maximum diameter on image Alvarez 31 of series 5. There is also 6 mm nodule in the posterior aspect of the left upper lobe on  image Alvarez 36 and additional smaller nodules in the left lung. Upper Abdomen: There is a 9 mm oval, low density nodule in the left adrenal gland, measuring 22 Hounsfield units on image Alvarez 164 of series 3. Musculoskeletal: Thoracic spine degenerative changes. No evidence of bony metastatic disease. IMPRESSION: 1. Large right pleural effusion with mass effect, causing displacement of the mediastinum and heart to the left. There is extensive pleural soft tissue thickening on the right. 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 2. Multiple left lung nodules, suspicious for metastases. 3. Small left adrenal probable adenoma. 4. Sub-centimeter thyroid nodule(s) noted, too small to characterize, but most likely benign in the absence of known clinical risk factors for thyroid carcinoma. 5. Small pericardial effusion. 6. Aortic atherosclerosis. Electronically Signed   By: Claudie Revering M.D.   On: 06/23/2016 15:20     Marjie Skiff, MD 06/24/2016, 6:19 AM PGY-2, [place] Intern pager: [phone], text pages welcome

## 2016-06-24 NOTE — Progress Notes (Signed)
  Echocardiogram 2D Echocardiogram has been performed.  Erik Alvarez 06/24/2016, 2:39 PM

## 2016-06-24 NOTE — Consult Note (Signed)
Name: Erik Alvarez MRN: [id number] DOB: Jul 16, 1930    ADMISSION DATE:  06/22/2016 CONSULTATION DATE:  06/24/16  REFERRING MD :  Dr. 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    At baseline, patient is a resident at [facility].  Information obtained from daughter as patient has baseline dementia.  She reports he previously was admitted at [facility] for urinary retention requiring foley placement in early September.  At that time, he was told he had pulmonary nodules on the CT abdomen but no mention of effusion.  He later suffered a fall the weekend of October 6th and was seen in the ER and sent home. He has had progressive weight loss since early September and decreased appetite.    He initially reported 10/28 sharp substernal chest pain 7/10 but was unable to differentiate due to dementia.  In ER, he was hemodynamically stable.  Due to chest pain, enzymes were cycled and mildly elevated at 0.04.   EKG showed sinus rhythm with PVC's, no STEMI.  CT of the head was evaluated which showed no acute abnormalities.   CXR completed demonstrated a moderate to large right pleural effusion.  He was further admitted by [facility] for evaluation.  Follow up CT of the chest revealed a large right effusion with mediastinal shift to the left, right pleural thickening and left pulmonary nodules.    PCCM consulted for evaluation of nodules.  PAST MEDICAL HISTORY :   has a past medical history of Chest pain; Coronary artery disease; Dementia; Dizziness; Hypertension; SOB (shortness of breath); Syncope; and VT (ventricular tachycardia) ([place]).  has a past surgical history that includes Insert / replace /  remove pacemaker.   Prior to Admission medications   Medication Sig Start Date End Date Taking? Authorizing Provider  Acetaminophen (TYLENOL PO) Take 1 tablet by mouth 2 (two) times daily.   Yes Historical Provider, MD  aspirin EC 81 MG tablet Take 81 mg by mouth daily.   Yes Historical Provider, MD  carvedilol (COREG) 12.5 MG tablet Take 12.5 mg by mouth 2 (two) times daily with a meal.   Yes Historical Provider, MD  donepezil (ARICEPT) 10 MG tablet Take 10 mg by mouth at bedtime.   Yes Historical Provider, MD  ergocalciferol (VITAMIN D2) 50000 units capsule Take 50,000 Units by mouth every Wednesday.    Yes Historical Provider, MD  Liniments (SALONPAS EX) Apply 1 patch topically as needed (for pain).   Yes Historical Provider, MD  memantine (NAMENDA) 5 MG tablet Take 2.5 mg by mouth 2 (two) times daily.   Yes Historical Provider, MD  Menthol, Topical Analgesic, (BENGAY EX) Apply 1 application topically as needed (for pain).   Yes Historical Provider, MD  simvastatin (ZOCOR) 40 MG tablet Take 40 mg by mouth daily at 6 PM.   Yes Historical Provider, MD  traMADol (ULTRAM) 50 MG tablet Take 1 every 6 hours for pain not helped by Tylenol Patient not taking: Reported on 06/22/2016 06/02/16   Milton Ferguson, MD   No Known Allergies  FAMILY HISTORY:  family history includes Diabetes in his brother.   SOCIAL HISTORY:  reports that  he has quit smoking. He has never used smokeless tobacco. He reports that he drinks alcohol. He reports that he does not use drugs.  REVIEW OF SYSTEMS:  POSITIVES IN BOLD Constitutional: Negative for fever, chills, weight loss, malaise/fatigue and diaphoresis.  HENT: Negative for hearing loss, ear pain, nosebleeds, congestion, sore throat, neck pain, tinnitus and ear discharge.   Eyes: Negative for blurred vision, double vision, photophobia, pain, discharge and redness.  Respiratory: Negative for cough, hemoptysis, sputum production, shortness of breath, wheezing and  stridor.   Cardiovascular: Negative for chest pain, palpitations, orthopnea, claudication, leg swelling and PND.  Gastrointestinal: Negative for heartburn, nausea, vomiting, abdominal pain, diarrhea, constipation, blood in stool and melena.  Genitourinary: Negative for dysuria, urgency, frequency, hematuria and flank pain.  Musculoskeletal: Negative for myalgias, back pain, joint pain and falls.  Skin: Negative for itching and rash.  Neurological: Negative for dizziness, tingling, tremors, sensory change, speech change, focal weakness, seizures, loss of consciousness, weakness and headaches.  Endo/Heme/Allergies: Negative for environmental allergies and polydipsia. Does not bruise/bleed easily.  SUBJECTIVE:   VITAL SIGNS: Temp:  [98 F (36.7 C)-98.2 F (36.8 C)] 98.2 F (36.8 C) (10/30 0551) Pulse Rate:  [60-62] 62 (10/29 1947) Resp:  [20] 20 (10/30 0551) BP: (131-159)/(59-69) 154/59 (10/30 0551) SpO2:  [90 %-94 %] 94 % (10/30 0551)  PHYSICAL EXAMINATION: General:  Thin elderly male in NAD Neuro:  Awake, alert, recognizes his daughter, MAE HEENT:  Pupils 44m, good dentition, no JVD Cardiovascular:  s1s2 rrr, distant tones Lungs:  Tachypnea, mild abdominal accessory use, minimal air movement on R, clear on left 3 Abdomen:  Soft, non-tender, bsx4 active  Musculoskeletal:  No acute deformities Skin:  Warm/dry, no edema rashes or lesions   Recent Labs Lab 06/22/16 1300 06/23/16 0731 06/24/16 0306  NA 138 137 136  K 4.1 3.9 4.1  CL 106 102 103  CO2 '22 26 24  '$ BUN 25* 17 18  CREATININE 1.21 0.92 0.93  GLUCOSE 219* 171* 157*    Recent Labs Lab 06/22/16 1300 06/23/16 0731 06/24/16 0306  HGB 10.2* 10.5* 11.2*  HCT 30.9* 32.8* 33.4*  WBC 9.1 8.3 8.8  PLT 274 273 297   Dg Chest 2 View  Result Date: 06/22/2016 CLINICAL DATA:  Substernal chest pain, shortness of breath, syncope EXAM: CHEST  2 VIEW COMPARISON:  None. FINDINGS: Moderate to large right pleural effusion.  Associated right lower lobe opacity, likely atelectasis. Left lung is clear.  No pneumothorax. The heart is normal in size.  Left subclavian ICD. Degenerative changes of the visualized thoracolumbar spine. IMPRESSION: Moderate to large right pleural effusion. Associated right lower lobe opacity, likely atelectasis. Electronically Signed   By: SJulian HyM.D.   On: 06/22/2016 13:54   Ct Head Wo Contrast  Result Date: 06/22/2016 CLINICAL DATA:  Acute mental status and low blood pressure. EXAM: CT HEAD WITHOUT CONTRAST TECHNIQUE: Contiguous axial images were obtained from the base of the skull through the vertex without intravenous contrast. COMPARISON:  None. FINDINGS: Brain: No subdural, epidural, or subarachnoid hemorrhage. Increased CSF near the left temporal horn, likely in an arachnoid cyst. Ventricles and sulci are prominent, likely due to atrophy. The cerebellum, brainstem, and basal cisterns are within normal limits. Scattered white matter changes most focal in the right frontal lobe on series 2, image 21. No acute cortical ischemia or infarct. No mass, mass effect, or midline shift. Vascular: Atherosclerotic changes seen in the intracranial carotid arteries. Skull: Normal. Negative for fracture or focal lesion.  Sinuses/Orbits: No acute finding. Other: None. IMPRESSION: 1. No acute abnormality identified. Electronically Signed   By: Dorise Bullion III M.D   On: 06/22/2016 18:50   Ct Chest W Contrast  Result Date: 06/23/2016 CLINICAL DATA:  Moderate to large-sized right pleural effusion on chest radiographs yesterday. Substernal chest pain, shortness of breath and syncope. EXAM: CT CHEST WITH CONTRAST TECHNIQUE: Multidetector CT imaging of the chest was performed during intravenous contrast administration. CONTRAST:  29m ISOVUE-300 IOPAMIDOL (ISOVUE-300) INJECTION 61% COMPARISON:  Chest radiographs obtained yesterday. FINDINGS: Cardiovascular: Dense aortic atherosclerotic calcifications.  Normal sized heart. Small pericardial effusion with a maximum thickness of 7 mm. There are multiple epicardial fat nodules anteriorly on the right. The largest measures 1.3 x 0.6 cm on image number 134 of series 3. Mediastinum/Nodes: The mediastinum and heart are shifted to the left by a large right pleural effusion. No enlarged lymph nodes. 3 mm right lobe thyroid nodule and 2 mm left lobe thyroid nodule on image number 20 of series 3. Lungs/Pleura: Large right pleural effusion with peripheral, irregular, non continuous and contiguous areas of pleural soft tissue thickening. There is also marked right lung atelectasis. Multiple left lung nodules are demonstrated. The largest is in the left lower lobe, measuring 1.4 cm in maximum diameter on image number 110 of series 5. The next largest is in the left upper lobe, measuring 7 mm in maximum diameter on image number 31 of series 5. There is also 6 mm nodule in the posterior aspect of the left upper lobe on image number 36 and additional smaller nodules in the left lung. Upper Abdomen: There is a 9 mm oval, low density nodule in the left adrenal gland, measuring 22 Hounsfield units on image number 164 of series 3. Musculoskeletal: Thoracic spine degenerative changes. No evidence of bony metastatic disease. IMPRESSION: 1. Large right pleural effusion with mass effect, causing displacement of the mediastinum and heart to the left. There is extensive pleural soft tissue thickening on the right. 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 2. Multiple left lung nodules, suspicious for metastases. 3. Small left adrenal probable adenoma. 4. Sub-centimeter thyroid nodule(s) noted, too small to characterize, but most likely benign in the absence of known clinical risk factors for thyroid carcinoma. 5. Small pericardial effusion. 6. Aortic atherosclerosis. Electronically Signed    By: SClaudie ReveringM.D.   On: 06/23/2016 15:20     SIGNIFICANT EVENTS  10/28  Admit with chest pain, CXR + for mod to large R pleural effusion  10/30  PCCM consulted for effusion   STUDIES:  CT Head 10/28 >> no acute abnormalities  CT Chest 10/29 >> large R pleural effusion with mass effect, causing displacement of the mediastinum to the left, extensive subpleural thickening on the R, multiple left nodules, small left adrenal adenoma, small thyroid nodules, small pericardial effusion   ASSESSMENT / PLAN:  Right Pleural Effusion - admitted with complaints of left chest pain, large right effusion found on CXR, hx of recent fall but not on anticoagulation.  Effusion does not appear complicated by CT but will need [place].  Pleural thickening noted which may make thoracentesis difficult.  DDx includes parapneumonic effusion / infection, hemothorax post fall and malignancy.  Given findings of left nodules / masses & pleural thickening raises concern for malignancy.    Plan: Bedside assessment of effusion with [place] Plan for thoracentesis this am  Patient's daughter consented  at bedside  Send pleural fluid for protein, LDH, cell count with diff, C&S, cytology  Follow up CXR post thora Will need repeat CXR in next 2-3 days to assess rate of re-accumulation   Goals of Care - Patient is DNR and daughter indicates he would not want to pursue treatment if this is malignant effusion but she would want to talk with siblings.  Also, they would want therapy aimed at comfort.  If pleurX needed for comfort, they would accept.    [name], NP-C [facility] Pgr: [phone] or if no answer [phone] 06/24/2016, 9:23 AM  Attending note: I have seen and examined the patient with nurse practitioner/resident and agree with the note. History, labs and imaging reviewed.  Mr. Bitting is a 82 Y/O with PMH as above admitted with chest pain. Chest images reviewed > they show a large rt  effusion causing nearly complete collapse of the lung. Along with subcentimeter pulmonary nodules and pleural thickening. He had a CT abdomen pelvis in September 2017 that showed left lower lobe nodule but no effusion at that time.  Differential diagnosis includes malignant effusion, parapneumonic. We will proceed with a thoracentesis for further evaluation.  Marshell Garfinkel MD [facility] Pager [phone] If no answer or after 3pm call: [phone] 06/24/2016, 1:49 PM

## 2016-06-24 NOTE — Evaluation (Signed)
Occupational Therapy Evaluation Patient Details Name: Erik Alvarez MRN: [id number] DOB: 1929/09/03 Today's Date: 06/24/2016    History of Present Illness Pt adm with chest pain. PMH - dementia, urinary retention with foley, HTN, CAD, Pacer   Clinical Impression   Pt admitted with the above diagnosis and has the deficits outlined below. Pt would benefit from cont OT to increase independence with simple adls in sitting as well as safety with adls in standing so he can return home to independent living with his wife and caregiver doing as much for himself as possible.  Talked to daughter at length about pts current adl status and discussed the possible need for a higher level of care in the near future.  Pt may perform better with adls in his own environment due to his history of dementia but will continue to need the caregiver and HHOT to continue to encourage pt to be independent.    Follow Up Recommendations  Home health OT;Supervision/Assistance - 24 hour    Equipment Recommendations  None recommended by OT    Recommendations for Other Services       Precautions / Restrictions Precautions Precautions: Fall Restrictions Weight Bearing Restrictions: No      Mobility Bed Mobility Overal bed mobility: Needs Assistance Bed Mobility: Supine to Sit     Supine to sit: Mod assist     General bed mobility comments: Assist to elevate trunk from bed  Transfers Overall transfer level: Needs assistance Equipment used: Rolling walker (2 wheeled) Transfers: Sit to/from [facility] Sit to Stand: Mod assist Stand pivot transfers: Mod assist       General transfer comment: Assist to bring hips up and for balance. Min A from bed and mod A from low commode    Balance Overall balance assessment: Needs assistance Sitting-balance support: Feet supported;Bilateral upper extremity supported Sitting balance-Leahy Scale: Poor Sitting balance - Comments: UE  support Postural control: Posterior lean Standing balance support: Bilateral upper extremity supported;During functional activity Standing balance-Leahy Scale: Poor Standing balance comment: Pt had to have significant external support to remain standing from therapist and walker.                            ADL Overall ADL's : Needs assistance/impaired Eating/Feeding: Minimal assistance;Sitting Eating/Feeding Details (indicate cue type and reason): min assist for initiation and follow through and assist for set up. Grooming: Dance movement psychotherapist;Wash/dry hands;Oral care;Minimal assistance;Sitting Grooming Details (indicate cue type and reason): cues for sequencing and for initiation. Upper Body Bathing: Minimal assitance;Sitting Upper Body Bathing Details (indicate cue type and reason): encouragement to participate Lower Body Bathing: Maximal assistance;Sit to/from stand Lower Body Bathing Details (indicate cue type and reason): Pt with posterior lean in standing requiring pt to hold to walker while someone else bathes bottom. Upper Body Dressing : Minimal assistance;Sitting   Lower Body Dressing: Maximal assistance;Sit to/from stand Lower Body Dressing Details (indicate cue type and reason): assist when standing due to posterior lean and assist to donn socks and shoes. Toilet Transfer: Moderate assistance;BSC;Stand-pivot Toilet Transfer Details (indicate cue type and reason): pt with posterior lean Toileting- Clothing Manipulation and Hygiene: Total assistance;Sit to/from stand       Functional mobility during ADLs: Moderate assistance;Rolling walker General ADL Comments: Pt has an aide at home but would like to remain as independent as he can.  Rec HHOT to train aid to let pt do as much as possible.  Pt not  steady on his feet but can do many adls in sitting and needs assist when standing.     Vision     Perception Perception Perception Tested?: No   Praxis Praxis Praxis  tested?: Within functional limits    Pertinent Vitals/Pain Pain Assessment: No/denies pain Faces Pain Scale: No hurt     Hand Dominance Right   Extremity/Trunk Assessment Upper Extremity Assessment Upper Extremity Assessment: Generalized weakness   Lower Extremity Assessment Lower Extremity Assessment: Defer to PT evaluation   Cervical / Trunk Assessment Cervical / Trunk Assessment: Normal   Communication Communication Communication: HOH   Cognition Arousal/Alertness: Awake/alert Behavior During Therapy: Flat affect Overall Cognitive Status: History of cognitive impairments - at baseline       Memory: Decreased short-term memory;Decreased recall of precautions             General Comments       Exercises       Shoulder Instructions      Home Living Family/patient expects to be discharged to:: Other (Comment) (Independent living with 12 hour aid and HHOT) Living Arrangements: Spouse/significant other Available Help at Discharge: Family;Personal care attendant;Available 24 hours/day Type of Home: Independent living facility Home Access: Level entry;Elevator     Home Layout: One level         Bathroom Toilet: Handicapped height     Home Equipment: Walker - 2 wheels   Additional Comments: Pt has hired caregivers as well during a good part of the day      Prior Functioning/Environment Level of Independence: Needs assistance  Gait / Transfers Assistance Needed: Amb with rolling walker ADL's / Homemaking Assistance Needed: Two weeks ago, pt could dress self, assist with bathing and toileting and walk w/o a device.  Pt has significantly declined the last 2-3 weeks.            OT Problem List: Decreased activity tolerance;Impaired balance (sitting and/or standing);Decreased safety awareness;Decreased cognition;Decreased knowledge of use of DME or AE;Decreased knowledge of precautions   OT Treatment/Interventions: Self-care/ADL training;Therapeutic  activities;DME and/or AE instruction    OT Goals(Current goals can be found in the care plan section) Acute Rehab OT Goals Patient Stated Goal: Not stated OT Goal Formulation: With patient/family Time For Goal Achievement: 07/08/16 Potential to Achieve Goals: Fair ADL Goals Pt Will Perform Eating: with set-up;sitting Pt Will Perform Grooming: with min guard assist;standing Pt Will Perform Lower Body Dressing: with min assist;sit to/from stand Pt Will Transfer to Toilet: with min guard assist;regular height toilet;ambulating  OT Frequency: Min 2X/week   Barriers to D/C:            Co-evaluation              End of Session Equipment Utilized During Treatment: Surveyor, mining Communication: Mobility status  Activity Tolerance: Patient tolerated treatment well Patient left: in chair;with call bell/phone within reach;with chair alarm set;with family/visitor present   Time: 1257-1322 OT Time Calculation (min): 25 min Charges:  OT General Charges $OT Visit: 1 Procedure OT Evaluation $OT Eval Moderate Complexity: 1 Procedure OT Treatments $Self Care/Home Management : 8-22 mins G-Codes: OT G-codes **NOT FOR INPATIENT CLASS** Functional Assessment Tool Used: clinical judgement Functional Limitation: Self care Self Care Current Status (O3500): At least 60 percent but less than 80 percent impaired, limited or restricted Self Care Goal Status (X3818): At least 60 percent but less than 80 percent impaired, limited or restricted Self Care Discharge Status [phone]): At least 40 percent but less than 60  percent impaired, limited or restricted  Glenford Peers 06/24/2016, 1:34 PM [phone]

## 2016-06-24 NOTE — Procedures (Signed)
Thoracentesis Procedure Note  Pre-operative Diagnosis: Right Pleural Effusion   Post-operative Diagnosis: same  Indications: Symptom relief and diagnostics   Procedure Details  Consent: Informed consent was obtained. Risks of the procedure were discussed including: infection, bleeding, pain, pneumothorax.  Under sterile conditions the patient was positioned. Betadine solution and sterile drapes were utilized.  1% plain lidocaine was used to anesthetize the rib space which was identified with real time u/s. Fluid was obtained without any difficulties and minimal blood loss.  A dressing was applied to the wound and wound care instructions were provided.   Findings 1450 ml of rust colored pleural fluid was obtained. A sample was sent to Pathology for cell counts, as well as for infection analysis.  Complications:  None; patient tolerated the procedure well.          Condition: stable  Plan A follow up chest x-ray was ordered.   Hayden Pedro, AG-ACNP [facility]  Pgr: [phone]  PCCM Pgr: [phone]

## 2016-06-24 NOTE — Progress Notes (Signed)
PT Cancellation Note  Patient Details Name: Erik Alvarez MRN: [id number] DOB: 02-20-1930   Cancelled Treatment:    Reason Eval/Treat Not Completed: Patient at procedure or test/unavailable. Pt undergoing echo at bedside. PT to return as able.   Kingsley Callander 06/24/2016, 3:17 PM   Kittie Plater, PT, DPT Pager #: [phone] Office #: [phone]

## 2016-06-24 NOTE — Progress Notes (Signed)
Notified attending of troponin result.  No orders received.  Pt resting at this time, no s/s of distress.  Will cont to monitor.

## 2016-06-25 DIAGNOSIS — J9601 Acute respiratory failure with hypoxia: Secondary | ICD-10-CM

## 2016-06-25 DIAGNOSIS — I2510 Atherosclerotic heart disease of native coronary artery without angina pectoris: Secondary | ICD-10-CM

## 2016-06-25 DIAGNOSIS — I249 Acute ischemic heart disease, unspecified: Secondary | ICD-10-CM

## 2016-06-25 LAB — BASIC METABOLIC PANEL
Anion gap: 8 (ref 5–15)
BUN: 18 mg/dL (ref 6–20)
CO2: 26 mmol/L (ref 22–32)
CREATININE: 0.94 mg/dL (ref 0.61–1.24)
Calcium: 8.3 mg/dL — ABNORMAL LOW (ref 8.9–10.3)
Chloride: 102 mmol/L (ref 101–111)
Glucose, Bld: 150 mg/dL — ABNORMAL HIGH (ref 65–99)
POTASSIUM: 3.9 mmol/L (ref 3.5–5.1)
SODIUM: 136 mmol/L (ref 135–145)

## 2016-06-25 LAB — CBC
HCT: 32.2 % — ABNORMAL LOW (ref 39.0–52.0)
Hemoglobin: 10.5 g/dL — ABNORMAL LOW (ref 13.0–17.0)
MCH: 28.4 pg (ref 26.0–34.0)
MCHC: 32.6 g/dL (ref 30.0–36.0)
MCV: 87 fL (ref 78.0–100.0)
PLATELETS: 262 10*3/uL (ref 150–400)
RBC: 3.7 MIL/uL — AB (ref 4.22–5.81)
RDW: 13.5 % (ref 11.5–15.5)
WBC: 8.6 10*3/uL (ref 4.0–10.5)

## 2016-06-25 LAB — PATHOLOGIST SMEAR REVIEW

## 2016-06-25 MED ORDER — POLYETHYLENE GLYCOL 3350 17 G PO PACK
17.0000 g | PACK | Freq: Every day | ORAL | Status: DC
  Administered 2016-06-26: 17 g via ORAL
  Filled 2016-06-25: qty 1

## 2016-06-25 NOTE — Progress Notes (Signed)
Family Medicine Teaching Service Daily Progress Note Intern Pager: [phone]  Patient name: Erik Alvarez [facility] Medical record number: [id number] Date of birth: [birth year] Age: 82 y.o. Gender: male  Primary Care Provider: Reymundo Poll, MD Consultants: Cardiology  Code Status: DNR   Pt Overview and Major Events to Date:  10/28: Patient admitted for ACS r/o  10/30: Thoracentesis, Echocardiogram  Assessment and Plan: Erik Alvarez is a 82 y.o. male with a past medical history significant for CAD (s/p stent of RCA 2010), Dementia, HTN, VT(Pacemaker) presented with substernal chest pain radiating to right chest then left shoulder concerning for ACS in the setting of a history of CAD and hypertension.  #Chest Pain, acute Patient had Echo on 10/30 which showed an EF 30%-35% with grade 1 diastolic and akinesis of the basal mid inferior myocardium. Patient also had a thoracentesis with 1.5L red, cloudy fluid suggestive of malignancy with left lung nodules seen on imaging from 9/28. CT chest showed extensive pleural soft tissue thickening on the right. 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 Pain most likely 2/2 pleural effusion in the setting of probable bilateral lung cancer.  --Follow up on pleural fluid analysis (light's criteria) --Will discuss case with PCCM and oncology --Continue simvastatin 40 mg daily --Continue 81 mg aspirin daily  --Tylenol 650g q4 prn --Zofran 4 mg q6 prn  # Cognitive Decline, Dementia, chronic Patient has history of dementia, previously able to perform most ADLs. Patient patient recently moved to [facility] retirement home in September. Patient is able to perform most ADLs on his own on a daily basis. However patient had a recent fall about 3 weeks ago and since that fall has had a decline in ability to take care of himself.   --Continue memantine 2.5 mg twice a  day --Discontinue donepezil 10 mg daily at bedtime  #Large to moderate pleural effusion, acute 1.5L of pleural fluid taken out post thoracentesis fluid analysis pending. --Will Discuss with Pulmonology --Follow up on CVTS consult --Consider repeat chest CT now that we have a non obstructed right lung for further characterization Right lung parenchyma. --Follow up on pleural fluid analysis ( cytology, gram stain, LDH, protein...) most likely secondary to malignancy  --Monitor vital signs and respiration status   #Fall, acute Patient has had witnessed and possible unwitnessed falls since moving to his retirement home.These falls event could be related to new environment and unfamiliarity with new settings. Could also be secondary to physiologic decline. However, the way these falls have been described by family members, they are more consistent with syncopal episode. Our differential diagnosis for her syncopal episode would be vasovagal vs orthostatic vs arrhythmias.Pacemaker interrogation showed no arrhythmias and leads were stable. PT  Evaluation with recommendations for home health PT and 24hrsupervision. --Orthostatics vitals as needed   #Urinary retention:  Patient with hx of urinary retention due to BPH, currently with catheter in placed. Followed by urology, plan for Urolift. --Will keep foley in -- Outpatient follow up with urology   #Hx of CAD ( s/p stent of RCA (2010) --Continue aspirin 81 mg daily  #Hyperlipidemia --Continue simvastatin 40 mg daily  FEN/GI: Heart healthy Diet  Prophylaxis: Lovenox    Disposition: Pending cardiology recs and further imaging results   Subjective:  No acute events overnight. Patient feeling fine this morning, does not have an appetite but breathing is not laborious, though. Daughter is not in the room this morning.  Objective:  Temp:  [97.7 F (36.5 C)-98.5 F (36.9 C)] 97.7 F (36.5 C) (10/31 0500) Pulse Rate:  [60-70] 60 (10/31  0500) Resp:  [18-20] 18 (10/31 0500) BP: (128-134)/(51-66) 128/66 (10/31 0500) SpO2:  [91 %-98 %] 92 % (10/31 0500) Physical Exam: General: NAD, confused, cooperative  Cardiovascular: RRR. No murmurs appreciated. No pain to palpation of chest wall.  Respiratory: CTAB. Normal WOB.  Abdomen: soft, NTND  Extremities: Warm to touch. No LE edema.   Laboratory:  Recent Labs Lab 06/23/16 0731 06/24/16 0306 06/25/16 0220  WBC 8.3 8.8 8.6  HGB 10.5* 11.2* 10.5*  HCT 32.8* 33.4* 32.2*  PLT 273 297 262    Recent Labs Lab 06/23/16 0731 06/24/16 0306 06/24/16 1618 06/25/16 0220  NA 137 136  --  136  K 3.9 4.1  --  3.9  CL 102 103  --  102  CO2 26 24  --  26  BUN 17 18  --  18  CREATININE 0.92 0.93  --  0.94  CALCIUM 8.4* 8.5*  --  8.3*  PROT  --   --  6.1*  --   GLUCOSE 171* 157*  --  150*   .  Imaging/Diagnostic Tests: Dg Chest 2 View  Result Date: 06/22/2016 CLINICAL DATA:  Substernal chest pain, shortness of breath, syncope EXAM: CHEST  2 VIEW COMPARISON:  None. FINDINGS: Moderate to large right pleural effusion. Associated right lower lobe opacity, likely atelectasis. Left lung is clear.  No pneumothorax. The heart is normal in size.  Left subclavian ICD. Degenerative changes of the visualized thoracolumbar spine. IMPRESSION: Moderate to large right pleural effusion. Associated right lower lobe opacity, likely atelectasis. Electronically Signed   By: Julian Hy M.D.   On: 06/22/2016 13:54    Ct Head Wo Contrast  Result Date: 06/22/2016 CLINICAL DATA:  Acute mental status and low blood pressure. EXAM: CT HEAD WITHOUT CONTRAST TECHNIQUE: Contiguous axial images were obtained from the base of the skull through the vertex without intravenous contrast. COMPARISON:  None. FINDINGS: Brain: No subdural, epidural, or subarachnoid hemorrhage. Increased CSF near the left temporal horn, likely in an arachnoid cyst. Ventricles and sulci are prominent, likely due to atrophy. The  cerebellum, brainstem, and basal cisterns are within normal limits. Scattered white matter changes most focal in the right frontal lobe on series 2, image 21. No acute cortical ischemia or infarct. No mass, mass effect, or midline shift. Vascular: Atherosclerotic changes seen in the intracranial carotid arteries. Skull: Normal. Negative for fracture or focal lesion. Sinuses/Orbits: No acute finding. Other: None. IMPRESSION: 1. No acute abnormality identified. Electronically Signed   By: Dorise Bullion III M.D   On: 06/22/2016 18:50   Dg Chest Port 1 View  Result Date: 06/24/2016 CLINICAL DATA:  Status post right-sided thoracentesis. EXAM: PORTABLE CHEST 1 VIEW COMPARISON:  A chest CT 06/23/2016 FINDINGS: Decreased size of right pleural effusion following thoracentesis. No pneumothorax is identified. The left lung is clear. IMPRESSION: Status post thoracentesis with decreased size of right pleural effusion. No visualized pneumothorax. Electronically Signed   By: Ulyses Jarred M.D.   On: 06/24/2016 16:11     Marjie Skiff, MD 06/25/2016, 6:26 AM PGY-1, [place] Intern pager: [phone], text pages welcome

## 2016-06-25 NOTE — Discharge Summary (Signed)
[facility] Discharge Summary  Patient name: Erik Alvarez Medical record number: [id number] Date of birth: [birth year] Age: 82 y.o. Gender: male Date of Admission: 06/22/2016  Date of Discharge: 06/28/16 Admitting Physician: Blane Ohara McDiarmid, MD  Primary Care Provider: Reymundo Poll, MD Consultants: Palliative, Cardiology, Cardiothoracic Surgery  Indication for Hospitalization: ACS  Discharge Diagnoses/Problem List:  Large to Moderate Right-Sided Pleural Effusion Cognitive Decline, Dementia Unwitnessed Fall Urinary retention CAD  Disposition: Home w/ palliative  Discharge Condition: Stable, improved  Discharge Exam:  General: well nourished, well developed, in no acute distress with non-toxic appearance HEENT: normocephalic, atraumatic, moist mucous membranes Neck: supple, non-tender without lymphadenopathy CV: regular rate and rhythm without murmurs, rubs, or gallops Lungs: clear to auscultation bilaterally with normal work of breathing Abdomen: soft, non-tender, no masses or organomegaly palpable, normoactive bowel sounds Skin: warm, dry, no rashes or lesions, cap refill < 2 seconds Extremities: warm and well perfused, normal tone Psych: A&O to only self, normal affect  Brief Hospital Course:  Erik Alvarez a 82 y.o.malewith a past medical history significant for CAD (s/p stent of RCA 2010), Dementia, HTN, VT(Pacemaker) presented with substernal chest pain radiating to right chest then left shoulder concerning for ACS in the setting of a history of CAD and hypertension. Initial cardiac work up was unremarkable for ACS, however large right sided effusion was seen on admission CXR (10/28). Effusion was confirmed on CT chest (10/29) in addition to other parenchymal changes (i.e. pleural thickening on the right, multiple left lung nodules) concerning for malignancy. PCCM consulted, large volume thoracentesis yielded 1.5 L concerning for malignancy.  Pleural analysis consistent with exudative effusion in the setting of mutilple lung nodules and pleural thickening. Cytology from effusion fluid showed no organisms or growth. CVTS was consulted for placement of pleural drain but could not place due to short staff. IR was consulted and able to place right-sided PleurX drain on 11/03. Palliative was consulted and family agree to not pursue aggressive treatment approach. Patient was discharged to hospice 11/04.   Issues for Follow Up:  1. Chest pain resolved. PleurX drain inserted for right-sided pleural effusion. F/u with IR for further management. 2. Palliative will facilitate care given condition and family decision. 3. Foley cath remains in place for urinary retention. Outpatient f/u with urology. 4. HH PT for 24 hr supervision.  Significant Procedures: Thoracenthesis (10/30) Right pleural catheter drain (11/03)  Significant Labs and Imaging:   Recent Labs Lab 06/26/16 0314 06/27/16 0916 06/28/16 0159  WBC 8.1 12.3* 10.8*  HGB 11.0* 11.8* 11.4*  HCT 34.0* 35.8* 34.8*  PLT 256 297 288    Recent Labs Lab 06/23/16 0731 06/24/16 0306 06/24/16 0856 06/25/16 0220 06/26/16 0314 06/27/16 0916  NA 137 136  --  136 138 136  K 3.9 4.1  --  3.9 4.3 4.3  CL 102 103  --  102 104 102  CO2 26 24  --  26 24 21*  GLUCOSE 171* 157*  --  150* 124* 124*  BUN 17 18  --  18 16 22*  CREATININE 0.92 0.93  --  0.94 0.92 0.97  CALCIUM 8.4* 8.5*  --  8.3* 8.5* 8.7*  ALBUMIN  --   --  2.2*  --   --   --    Dg Chest 2 View Result Date: 06/22/2016 CLINICAL DATA:  Substernal chest pain, shortness of breath, syncope EXAM: CHEST  2 VIEW COMPARISON:  None. FINDINGS: Moderate to large right pleural effusion.  Associated right lower lobe opacity, likely atelectasis. Left lung is clear.  No pneumothorax. The heart is normal in size.  Left subclavian ICD. Degenerative changes of the visualized thoracolumbar spine. IMPRESSION: Moderate to large right pleural  effusion. Associated right lower lobe opacity, likely atelectasis. Electronically Signed   By: Julian Hy M.D.   On: 06/22/2016 13:54   Ct Head Wo Contrast Result Date: 06/22/2016 CLINICAL DATA:  Acute mental status and low blood pressure. EXAM: CT HEAD WITHOUT CONTRAST TECHNIQUE: Contiguous axial images were obtained from the base of the skull through the vertex without intravenous contrast. COMPARISON:  None. FINDINGS: Brain: No subdural, epidural, or subarachnoid hemorrhage. Increased CSF near the left temporal horn, likely in an arachnoid cyst. Ventricles and sulci are prominent, likely due to atrophy. The cerebellum, brainstem, and basal cisterns are within normal limits. Scattered white matter changes most focal in the right frontal lobe on series 2, image 21. No acute cortical ischemia or infarct. No mass, mass effect, or midline shift. Vascular: Atherosclerotic changes seen in the intracranial carotid arteries. Skull: Normal. Negative for fracture or focal lesion. Sinuses/Orbits: No acute finding. Other: None. IMPRESSION: 1. No acute abnormality identified. Electronically Signed   By: Dorise Bullion III M.D   On: 06/22/2016 18:50   Transthoracic Echocardiography Result Date: 06/24/2016 Study Conclusions - Left ventricle: The cavity size was normal. Wall thickness was normal. Systolic function was moderately to severely reduced. The estimated ejection fraction was in the range of 30% to 35%. Akinesis of the basal-midinferior myocardium. Doppler parameters are consistent with abnormal left ventricular relaxation (grade 1 diastolic dysfunction). - Aortic valve: Moderately calcified annulus. - Pericardium, extracardiac: A trivial pericardial effusion was identified.  No results found.    Results/Tests Pending at Time of Discharge: None  Discharge Medications:    Medication List    STOP taking these medications   aspirin EC 81 MG tablet   donepezil 10 MG  tablet Commonly known as:  ARICEPT   ergocalciferol 50000 units capsule Commonly known as:  VITAMIN D2   memantine 5 MG tablet Commonly known as:  NAMENDA   simvastatin 40 MG tablet Commonly known as:  ZOCOR   traMADol 50 MG tablet Commonly known as:  ULTRAM     TAKE these medications   BENGAY EX Apply 1 application topically as needed (for pain).   carvedilol 12.5 MG tablet Commonly known as:  COREG Take 12.5 mg by mouth 2 (two) times daily with a meal.   morphine CONCENTRATE 10 MG/0.5ML Soln concentrated solution Take 0.25 mLs (5 mg total) by mouth every hour as needed for moderate pain or shortness of breath.   ondansetron 4 MG disintegrating tablet Commonly known as:  ZOFRAN ODT Take 1 tablet (4 mg total) by mouth every 8 (eight) hours as needed for nausea or vomiting.   SALONPAS EX Apply 1 patch topically as needed (for pain).   senna-docusate 8.6-50 MG tablet Commonly known as:  Senokot-S Take 1 tablet by mouth at bedtime as needed for mild constipation.   TYLENOL PO Take 1 tablet by mouth 2 (two) times daily.       Discharge Instructions: Please refer to Patient Instructions section of EMR for full details.  Patient was counseled important signs and symptoms that should prompt return to medical care, changes in medications, dietary instructions, activity restrictions, and follow up appointments.   Follow-Up Appointments: Follow-up Information    [facility]ospice at [facility] .   Specialty:  Hospice and Palliative Medicine Why:  Home Hospice arranged Contact information: [place] [id number] [place], DO 06/29/2016, 1:30 PM PGY-1, [place]

## 2016-06-25 NOTE — Progress Notes (Signed)
Name: Erik Alvarez MRN: [id number] DOB: 02-Jul-1930    ADMISSION DATE:  06/22/2016 CONSULTATION DATE:  06/24/16  REFERRING MD :  Dr. 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    At baseline, patient is a resident at [facility].  Information obtained from daughter as patient has baseline dementia.  She reports he previously was admitted at [facility] for urinary retention requiring foley placement in early September.  At that time, he was told he had pulmonary nodules on the CT abdomen but no mention of effusion.  He later suffered a fall the weekend of October 6th and was seen in the ER and sent home. He has had progressive weight loss since early September and decreased appetite.    He initially reported 10/28 sharp substernal chest pain 7/10 but was unable to differentiate due to dementia.  In ER, he was hemodynamically stable.  Due to chest pain, enzymes were cycled and mildly elevated at 0.04.   EKG showed sinus rhythm with PVC's, no STEMI.  CT of the head was evaluated which showed no acute abnormalities.   CXR completed demonstrated a moderate to large right pleural effusion.  He was further admitted by [facility] for evaluation.  Follow up CT of the chest revealed a large right effusion with mediastinal shift to the left, right pleural thickening and left pulmonary nodules.    PCCM consulted for evaluation of nodules / effusion.   SUBJECTIVE:  Pt reports coughing, denies chest pain / SOB.   VITAL SIGNS: Temp:  [97.7 F (36.5 C)-98.5 F (36.9 C)] 97.7 F (36.5 C) (10/31 0500) Pulse Rate:  [60-70] 60 (10/31 0500) Resp:  [18-20] 18 (10/31 0500) BP: (128-134)/(51-66) 128/66  (10/31 0500) SpO2:  [91 %-98 %] 92 % (10/31 0500)  PHYSICAL EXAMINATION: General:  Thin elderly male in NAD Neuro:  Awake, alert, recognizes his daughter, MAE HEENT:  Pupils 47m, good dentition, no JVD Cardiovascular:  s1s2 rrr, distant tones Lungs:  Non-labored, diminished on L, clear on R  Abdomen:  Soft, non-tender, bsx4 active  Musculoskeletal:  No acute deformities Skin:  Warm/dry, no edema rashes or lesions   Recent Labs Lab 06/23/16 0731 06/24/16 0306 06/25/16 0220  NA 137 136 136  K 3.9 4.1 3.9  CL 102 103 102  CO2 '26 24 26  '$ BUN '17 18 18  '$ CREATININE 0.92 0.93 0.94  GLUCOSE 171* 157* 150*    Recent Labs Lab 06/23/16 0731 06/24/16 0306 06/25/16 0220  HGB 10.5* 11.2* 10.5*  HCT 32.8* 33.4* 32.2*  WBC 8.3 8.8 8.6  PLT 273 297 262   Ct Chest W Contrast  Result Date: 06/23/2016 CLINICAL DATA:  Moderate to large-sized right pleural effusion on chest radiographs yesterday. Substernal chest pain, shortness of breath and syncope. EXAM: CT CHEST WITH CONTRAST TECHNIQUE: Multidetector CT imaging of the chest was performed during intravenous contrast administration. CONTRAST:  760mISOVUE-300 IOPAMIDOL (ISOVUE-300) INJECTION 61% COMPARISON:  Chest radiographs obtained yesterday. FINDINGS: Cardiovascular: Dense aortic atherosclerotic calcifications. Normal sized heart. Small pericardial effusion with a maximum thickness of 7 mm. There are multiple epicardial fat nodules anteriorly on the right. The largest measures  1.3 x 0.6 cm on image number 134 of series 3. Mediastinum/Nodes: The mediastinum and heart are shifted to the left by a large right pleural effusion. No enlarged lymph nodes. 3 mm right lobe thyroid nodule and 2 mm left lobe thyroid nodule on image number 20 of series 3. Lungs/Pleura: Large right pleural effusion with peripheral, irregular, non continuous and contiguous areas of pleural soft tissue thickening. There is also marked right lung atelectasis. Multiple left  lung nodules are demonstrated. The largest is in the left lower lobe, measuring 1.4 cm in maximum diameter on image number 110 of series 5. The next largest is in the left upper lobe, measuring 7 mm in maximum diameter on image number 31 of series 5. There is also 6 mm nodule in the posterior aspect of the left upper lobe on image number 36 and additional smaller nodules in the left lung. Upper Abdomen: There is a 9 mm oval, low density nodule in the left adrenal gland, measuring 22 Hounsfield units on image number 164 of series 3. Musculoskeletal: Thoracic spine degenerative changes. No evidence of bony metastatic disease. IMPRESSION: 1. Large right pleural effusion with mass effect, causing displacement of the mediastinum and heart to the left. There is extensive pleural soft tissue thickening on the right. 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 2. Multiple left lung nodules, suspicious for metastases. 3. Small left adrenal probable adenoma. 4. Sub-centimeter thyroid nodule(s) noted, too small to characterize, but most likely benign in the absence of known clinical risk factors for thyroid carcinoma. 5. Small pericardial effusion. 6. Aortic atherosclerosis. Electronically Signed   By: Claudie Revering M.D.   On: 06/23/2016 15:20   Dg Chest Port 1 View  Result Date: 06/24/2016 CLINICAL DATA:  Status post right-sided thoracentesis. EXAM: PORTABLE CHEST 1 VIEW COMPARISON:  A chest CT 06/23/2016 FINDINGS: Decreased size of right pleural effusion following thoracentesis. No pneumothorax is identified. The left lung is clear. IMPRESSION: Status post thoracentesis with decreased size of right pleural effusion. No visualized pneumothorax. Electronically Signed   By: Ulyses Jarred M.D.   On: 06/24/2016 16:11     SIGNIFICANT EVENTS  10/28  Admit with chest pain, CXR + for mod to large R pleural effusion  10/30  PCCM  consulted for effusion   STUDIES:  CT Head 10/28 >> no acute abnormalities  CT Chest 10/29 >> large R pleural effusion with mass effect, causing displacement of the mediastinum to the left, extensive subpleural thickening on the R, multiple left nodules, small left adrenal adenoma, small thyroid nodules, small pericardial effusion   Pleural Fluid 10/30  LDH 2664 / serum 221 > 12.1  Protein 3.6 / serum 6.1 > 0.6 WBC 464  ASSESSMENT / PLAN:  Exudative Right Pleural Effusion - admitted with complaints of left chest pain, large right effusion found on CXR, hx of recent fall but not on anticoagulation.  Effusion does not appear complicated by CT or [place] assessment.  Pleural thickening noted.  DDx includes parapneumonic effusion / infection, hemothorax post fall and malignancy.  Given findings of left nodules / masses & pleural thickening raises concern for malignancy.    Plan: Follow pleural fluid  Repeat CXR in am to assess rate of re-accumulation Await cytology  Will ask palliative care to see to review for outpatient support  Goals of Care - Patient is DNR and daughter indicates he would not want to pursue treatment if this  is malignant effusion but she would want to talk with siblings.  Also, they would want therapy aimed at comfort.  If pleurX needed for comfort, they would accept.   PCCM will follow up again when cytology returned to further discuss findings with patient/family.  Daughter Baker Janus) updated on patients status.  She would like to wait until the pathology is finalized before discharge.  Family has discussed that they would not want to pursue treatment (chemo / XRT) if this in fact a malignancy.  They are open to PleurX to promote comfort if needed.  I introduced the concept of Palliative Care to the daughter.  He probably would qualify for palliative support based solely on his dementia.  They would like to meet to discuss outpatient plan of care / support options.   [name],  NP-C [facility] Pgr: [phone] or if no answer [phone] 06/25/2016, 8:29 AM  Attending note: I have seen and examined the patient with nurse practitioner/resident and agree with the note. History, labs and imaging reviewed.  Mr. Nephew is a 55 Y/O with PMH as above admitted with chest pain. Chest images reviewed > they show a large rt effusion causing nearly complete collapse of the lung. Along with subcentimeter pulmonary nodules and pleural thickening. He had a CT abdomen pelvis in September 2017 that showed left lower lobe nodule but no effusion at that time. Follow up CXR after thoracentesis shows reduced effusion  Differential diagnosis includes malignant effusion, parapneumonic.  Pleural fluid analysis shows exudative effusion. Cytology and final cultures are pending  We will follow again with you after these results are back.  Marshell Garfinkel MD [facility] Pager [phone] If no answer or after 3pm call: [phone] 06/25/2016, 12:23 PM

## 2016-06-25 NOTE — Progress Notes (Signed)
Physical Therapy Treatment Patient Details Name: Erik Alvarez MRN: [id number] DOB: July 31, 1930 Today's Date: 06/25/2016    History of Present Illness Pt adm with chest pain. PMH - dementia, urinary retention with foley, HTN, CAD, Pacer    PT Comments    The pt is requiring assistance due to decreased strength and endurance.  The pt will benefit from PT to facilitate as much functional progress as possible.  O2 ranged from 92-94% on room air and HR ranged from 61-77 BPM.  Continue with gait and balance next session.  Follow Up Recommendations  Home health PT;Supervision/Assistance - 24 hour     Equipment Recommendations  None recommended by PT    Recommendations for Other Services       Precautions / Restrictions Precautions Precautions: Fall Restrictions Weight Bearing Restrictions: No    Mobility  Bed Mobility Overal bed mobility: Needs Assistance Bed Mobility: Supine to Sit     Supine to sit: Mod assist     General bed mobility comments: mod assist to elevate trunk and used bed pad to scoot to EOB  Transfers Overall transfer level: Needs assistance Equipment used: Rolling walker (2 wheeled) Transfers: Sit to/from Stand Sit to Stand: Mod assist         General transfer comment: assist for support and balance  Ambulation/Gait Ambulation/Gait assistance: Min assist Ambulation Distance (Feet): 30 Feet (Additional trial of 20 ft) Assistive device: Rolling walker (2 wheeled) Gait Pattern/deviations: Step-through pattern;Decreased step length - right;Decreased step length - left;Shuffle;Trunk flexed   Gait velocity interpretation: <1.8 ft/sec, indicative of risk for recurrent falls General Gait Details: Assist for balance and support.  LOB several times. VCs for hand placement and to keep feet inside base of walker.  required two rest breaks.   Stairs            Wheelchair Mobility    Modified Rankin (Stroke Patients Only)       Balance      Sitting balance-Leahy Scale: Poor Sitting balance - Comments: UE support   Standing balance support: Bilateral upper extremity supported Standing balance-Leahy Scale: Poor Standing balance comment: Required constant support to remain standing.                    Cognition Arousal/Alertness: Lethargic Behavior During Therapy: WFL for tasks assessed/performed Overall Cognitive Status: History of cognitive impairments - at baseline       Memory: Decreased short-term memory;Decreased recall of precautions              Exercises      General Comments        Pertinent Vitals/Pain Pain Assessment: Faces Faces Pain Scale: No hurt    Home Living                      Prior Function            PT Goals (current goals can now be found in the care plan section) Acute Rehab PT Goals PT Goal Formulation: With family Time For Goal Achievement: 06/30/16 Potential to Achieve Goals: Fair Progress towards PT goals: Progressing toward goals    Frequency    Min 3X/week      PT Plan      Co-evaluation             End of Session Equipment Utilized During Treatment: Gait belt;Oxygen Activity Tolerance: Patient limited by fatigue Patient left: in chair;with call bell/phone within reach;with chair alarm set  Time: 1028-1050 PT Time Calculation (min) (ACUTE ONLY): 22 min  Charges:  $Gait Training: 8-22 mins                    G Codes:      Bary Castilla 07-24-2016, 12:00 PM Rito Ehrlich. [place], [place]

## 2016-06-25 NOTE — Consult Note (Signed)
[address]       [place], [address]             [id number]         Subjective:   The patient is an 82 year-old male with a past medical history significant for CAD (s/p stent of RCA 2010), Dementia, HTN, VT(Pacemaker) presented with substernal chest pain radiating to right chest then left shoulder concerning for ACS in the setting of a history of CAD and hypertension. The patient was diagnosed with a large pleural effusion on admission. He recently had a thoracentesis removing 1.5L of pleural fluid. He also has multiple left lung nodules on CT which are suspicious for metastatic cancer. Chest CT and chest xray reports below. We are consulted for placement of a pleurx catheter for recurrent pleural effusion. Of note the patient is a poor historian due to his chronic dementia and cognitive decline.   Patient Active Problem List   Diagnosis Date Noted  . Pleural effusion   . Acute cognitive decline   . Chest pain 06/22/2016  . ACS (acute coronary syndrome) ([place]) 06/22/2016   Past Medical History:  Diagnosis Date  . Chest pain   . Coronary artery disease   . Dementia   . Dizziness   . Hypertension   . SOB (shortness of breath)   . Syncope   . VT (ventricular tachycardia) ([place])     Past Surgical History:  Procedure Laterality Date  . INSERT / REPLACE / REMOVE PACEMAKER      Prescriptions Prior to Admission  Medication Sig Dispense Refill Last Dose  . Acetaminophen (TYLENOL PO) Take 1 tablet by mouth 2 (two) times daily.   06/22/2016 at Unknown time  . aspirin EC 81 MG tablet Take 81 mg by mouth daily.   06/22/2016 at Unknown time  . carvedilol (COREG) 12.5 MG tablet Take 12.5 mg by mouth 2 (two) times daily with a meal.   06/22/2016 at 0730  . donepezil (ARICEPT) 10 MG tablet Take 10 mg by mouth at bedtime.   06/21/2016 at Unknown time  . ergocalciferol (VITAMIN D2) 50000 units capsule Take 50,000 Units by mouth every Wednesday.    Past Week at Unknown  time  . Liniments (SALONPAS EX) Apply 1 patch topically as needed (for pain).   Unknown at Unknown  . memantine (NAMENDA) 5 MG tablet Take 2.5 mg by mouth 2 (two) times daily.   06/22/2016 at Unknown time  . Menthol, Topical Analgesic, (BENGAY EX) Apply 1 application topically as needed (for pain).   06/22/2016 at Unknown time  . simvastatin (ZOCOR) 40 MG tablet Take 40 mg by mouth daily at 6 PM.   06/21/2016 at Unknown time  . traMADol (ULTRAM) 50 MG tablet Take 1 every 6 hours for pain not helped by Tylenol (Patient not taking: Reported on 06/22/2016) 30 tablet 0 Not Taking at Unknown time    Data Review: CLINICAL DATA:  Moderate to large-sized right pleural effusion on chest radiographs yesterday. Substernal chest pain, shortness of breath and syncope.  EXAM: CT CHEST WITH CONTRAST  TECHNIQUE: Multidetector CT imaging of the chest was performed during intravenous contrast administration.  CONTRAST:  16m ISOVUE-300 IOPAMIDOL (ISOVUE-300) INJECTION 61%  COMPARISON:  Chest radiographs obtained yesterday.  FINDINGS: Cardiovascular: Dense aortic atherosclerotic calcifications. Normal sized heart. Small pericardial effusion with a maximum thickness of 7 mm. There are multiple epicardial fat nodules anteriorly on the right. The largest measures 1.3 x 0.6 cm  on image number 134 of series 3.  Mediastinum/Nodes: The mediastinum and heart are shifted to the left by a large right pleural effusion. No enlarged lymph nodes. 3 mm right lobe thyroid nodule and 2 mm left lobe thyroid nodule on image number 20 of series 3.  Lungs/Pleura: Large right pleural effusion with peripheral, irregular, non continuous and contiguous areas of pleural soft tissue thickening. There is also marked right lung atelectasis.  Multiple left lung nodules are demonstrated. The largest is in the left lower lobe, measuring 1.4 cm in maximum diameter on image number 110 of series 5. The next largest is  in the left upper lobe, measuring 7 mm in maximum diameter on image number 31 of series 5. There is also 6 mm nodule in the posterior aspect of the left upper lobe on image number 36 and additional smaller nodules in the left lung.  Upper Abdomen: There is a 9 mm oval, low density nodule in the left adrenal gland, measuring 22 Hounsfield units on image number 164 of series 3.  Musculoskeletal: Thoracic spine degenerative changes. No evidence of bony metastatic disease.  IMPRESSION: 1. Large right pleural effusion with mass effect, causing displacement of the mediastinum and heart to the left. There is extensive pleural soft tissue thickening on the right. 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 2. Multiple left lung nodules, suspicious for metastases. 3. Small left adrenal probable adenoma. 4. Sub-centimeter thyroid nodule(s) noted, too small to characterize, but most likely benign in the absence of known clinical risk factors for thyroid carcinoma. 5. Small pericardial effusion. 6. Aortic atherosclerosis.   Electronically Signed   By: Claudie Revering M.D.   On: 06/23/2016 15:20   CLINICAL DATA:  Status post right-sided thoracentesis.  EXAM: PORTABLE CHEST 1 VIEW  COMPARISON:  A chest CT 06/23/2016  FINDINGS: Decreased size of right pleural effusion following thoracentesis. No pneumothorax is identified. The left lung is clear.  IMPRESSION: Status post thoracentesis with decreased size of right pleural effusion. No visualized pneumothorax.   Electronically Signed   By: Ulyses Jarred M.D.   On: 06/24/2016 16:11  Physical Exam  Constitutional: He is well-developed, well-nourished, and in no distress. No distress.  HENT:  Head: Normocephalic.  Eyes:  Pinpoint pupils  Cardiovascular: Normal rate and regular rhythm.  Exam reveals no gallop and no  friction rub.   No murmur heard. Pulmonary/Chest: Effort normal and breath sounds normal. No respiratory distress.  Abdominal: Soft. Bowel sounds are normal. He exhibits no distension. There is no tenderness.  Neurological:  Confused. Not oriented  Skin: Skin is warm and dry.  Psychiatric:  Lack of judgement or memory    Plan:   Obtain update chest xray - monitor fluid  Could consider placement of Plurix - but patient is unable to consent and no family present.  No pathology has been resulted yet but suspect malignant pleural effusion  At most would place pleural drain so patient could go home Consider palat ive  care and not full cardiology workup [name]      [place] [place] [address] Office [phone]   [place]

## 2016-06-25 NOTE — Progress Notes (Addendum)
Patient Name: Erik Alvarez Date of Encounter: 06/25/2016  Primary Cardiologist: Dr. Curt Bears, no other Cardiologist  Hospital Problem List     Active Problems:   Chest pain   ACS (acute coronary syndrome) (HCC)   Pleural effusion   Acute cognitive decline     Subjective   No chest pain; Confused; denies shortness of breath.  Inpatient Medications    Scheduled Meds: . aspirin  324 mg Oral Once  . aspirin EC  81 mg Oral Daily  . carvedilol  12.5 mg Oral BID WC  . enoxaparin (LOVENOX) injection  40 mg Subcutaneous Q24H  . memantine  2.5 mg Oral BID  . simvastatin  40 mg Oral q1800   Continuous Infusions:   PRN Meds: acetaminophen, nitroGLYCERIN, ondansetron (ZOFRAN) IV   Vital Signs    Vitals:   06/24/16 0551 06/24/16 1354 06/24/16 2010 06/25/16 0500  BP: (!) 154/59 (!) 134/51 (!) 130/57 128/66  Pulse:  70 69 60  Resp: '20 19 20 18  '$ Temp: 98.2 F (36.8 C)  98.5 F (36.9 C) 97.7 F (36.5 C)  TempSrc:   Oral Oral  SpO2: 94% 98% 91% 92%  Weight:      Height:        Intake/Output Summary (Last 24 hours) at 06/25/16 1126 Last data filed at 06/24/16 2239  Gross per 24 hour  Intake              460 ml  Output              353 ml  Net              107 ml   Filed Weights   06/22/16 1250 06/22/16 1832  Weight: 147 lb (66.7 kg) 140 lb 10.5 oz (63.8 kg)    Physical Exam    GEN: Elderly male   HEENT: Grossly normal.  Neck: Supple, no JVD, carotid bruits, or masses. Cardiac: RRR, no murmurs, rubs, or gallops. No clubbing, cyanosis, edema.  Radials/DP/PT 2+ and equal bilaterally.  Respiratory:  Respirations regular and unlabored, clear to auscultation bilaterally. GI: Soft, nontender, nondistended, BS + x 4. MS: no deformity or atrophy. Skin: warm and dry, no rash. Neuro:  Strength and sensation are intact. Psych: AAOx3.  Normal affect.  Labs    CBC  Recent Labs  06/24/16 0306 06/25/16 0220  WBC 8.8 8.6  HGB 11.2* 10.5*  HCT 33.4* 32.2*  MCV  85.4 87.0  PLT 297 836   Basic Metabolic Panel  Recent Labs  06/24/16 0306 06/25/16 0220  NA 136 136  K 4.1 3.9  CL 103 102  CO2 24 26  GLUCOSE 157* 150*  BUN 18 18  CREATININE 0.93 0.94  CALCIUM 8.5* 8.3*   Liver Function Tests  Recent Labs  06/24/16 0856 06/24/16 1618  PROT  --  6.1*  ALBUMIN 2.2*  --    Cardiac Enzymes  Recent Labs  06/22/16 2303 06/23/16 0424 06/24/16 0306  TROPONINI <0.03 <0.03 0.04*   Fasting Lipid Panel  Recent Labs  06/24/16 1618  CHOL 141     Telemetry     A paced- Personally Reviewed  ECG    A paced - Personally Reviewed  Radiology    Ct Chest W Contrast  Result Date: 06/23/2016 CLINICAL DATA:  Moderate to large-sized right pleural effusion on chest radiographs yesterday. Substernal chest pain, shortness of breath and syncope. EXAM: CT CHEST WITH CONTRAST TECHNIQUE: Multidetector CT imaging of the chest was  performed during intravenous contrast administration. CONTRAST:  62m ISOVUE-300 IOPAMIDOL (ISOVUE-300) INJECTION 61% COMPARISON:  Chest radiographs obtained yesterday. FINDINGS: Cardiovascular: Dense aortic atherosclerotic calcifications. Normal sized heart. Small pericardial effusion with a maximum thickness of 7 mm. There are multiple epicardial fat nodules anteriorly on the right. The largest measures 1.3 x 0.6 cm on image number 134 of series 3. Mediastinum/Nodes: The mediastinum and heart are shifted to the left by a large right pleural effusion. No enlarged lymph nodes. 3 mm right lobe thyroid nodule and 2 mm left lobe thyroid nodule on image number 20 of series 3. Lungs/Pleura: Large right pleural effusion with peripheral, irregular, non continuous and contiguous areas of pleural soft tissue thickening. There is also marked right lung atelectasis. Multiple left lung nodules are demonstrated. The largest is in the left lower lobe, measuring 1.4 cm in maximum diameter on image number 110 of series 5. The next largest is in  the left upper lobe, measuring 7 mm in maximum diameter on image number 31 of series 5. There is also 6 mm nodule in the posterior aspect of the left upper lobe on image number 36 and additional smaller nodules in the left lung. Upper Abdomen: There is a 9 mm oval, low density nodule in the left adrenal gland, measuring 22 Hounsfield units on image number 164 of series 3. Musculoskeletal: Thoracic spine degenerative changes. No evidence of bony metastatic disease. IMPRESSION: 1. Large right pleural effusion with mass effect, causing displacement of the mediastinum and heart to the left. There is extensive pleural soft tissue thickening on the right. 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 2. Multiple left lung nodules, suspicious for metastases. 3. Small left adrenal probable adenoma. 4. Sub-centimeter thyroid nodule(s) noted, too small to characterize, but most likely benign in the absence of known clinical risk factors for thyroid carcinoma. 5. Small pericardial effusion. 6. Aortic atherosclerosis. Electronically Signed   By: SClaudie ReveringM.D.   On: 06/23/2016 15:20   Dg Chest Port 1 View  Result Date: 06/24/2016 CLINICAL DATA:  Status post right-sided thoracentesis. EXAM: PORTABLE CHEST 1 VIEW COMPARISON:  A chest CT 06/23/2016 FINDINGS: Decreased size of right pleural effusion following thoracentesis. No pneumothorax is identified. The left lung is clear. IMPRESSION: Status post thoracentesis with decreased size of right pleural effusion. No visualized pneumothorax. Electronically Signed   By: KUlyses JarredM.D.   On: 06/24/2016 16:11    Cardiac Studies  Transthoracic Echocardiography 06/24/16 Study Conclusions  - Left ventricle: The cavity size was normal. Wall thickness was   normal. Systolic function was moderately to severely reduced. The   estimated ejection fraction was in the range of 30%  to 35%.   Akinesis of the basal-midinferior myocardium. Doppler parameters   are consistent with abnormal left ventricular relaxation (grade 1   diastolic dysfunction). - Aortic valve: Moderately calcified annulus. - Pericardium, extracardiac: A trivial pericardial effusion was   identified.   Left heart cath and PCI in Sept. 2010 NCornishGDOB:[birth year]MFIE:[id number] DATE:09/10/2010SEX:M ROOM:[facility] 05 PERCUTANEOUS TRANSLUMINAL INTERVENTION REPORT DATE OF PROCEDURE:05/05/2009 PRIMARY CARE PHYSICIAN:Dr. MLenna SciaraChampe-Seagle PRIMARY CARDIOLOGIST: Dr. DChristie NottinghamINTERVENTIONIST:David JGearlean Alf MD, [facility] CenterINDICATIONS FOR INTERVENTION: Patient with new history of nonsustained ventricular tachycardia and abnormal stress Echocardiogram. PROCEDURE PERFORMED: Intracoronary stent placement times two in the native right coronary artery. EQUIPMENT USED: Guiding catheter was a 6 [id number]. Guidewire was a 0.014 All-Star wire. Stent  the mid vessel was a 3.0 mm x 23 mm Xience drug-eluting stent. The proximal segment was a 3.0 mm x 18 mm Xience drug-eluting stent. MEDICATIONS GIVEN DURING THIS PROCEDURE: 1. Heparin 2500 units IV. 2.Integrilin double bolus and infusion. SUMMARY: Mr. Meeker is a 82 year old gentleman who was recently seen by Dr. Purcell Nails and had an abnormal stress echocardiogram. He had nonsustained ventricular tachycardia. The patient had a remote history of myocardial infarction in the early 90s and percutaneous transluminal coronary angioplasty at that time. Today, he underwent elective cardiac catheterization by Dr. Purcell Nails and was found to have 2 high-grade stenoses in the right coronary artery. The mid right coronary artery (RCA) had an 80% lesion and the proximal right coronary artery, a 70% stenosis. After diagnostic catheterization was completed, he was given  adequate IV heparin to obtain an ACT of 254 seconds. A 6 [place] JR4 guide was placed coaxial to the ostium of the right coronary artery and the All-Star wire was used to cross the stenoses with minimal difficulty. I then directly stented the mid portion with a 3.0 mm x 23 mm Xience drug-eluting stent. This stent was deployed at 14 atmospheres for 1 minute and 30 seconds. After this was deployed, the mid stenosis was reduced from 80% to 0%. I then removed the stent balloon over the guidewire and placed the proximal stent in the most stenotic portion of that area. This 3.0 mm x 18 mm Xience was then deployed also for 90 seconds for 16 atmospheres. During these inflations, he did not have any significant chest discomfort, but was extremely bradycardic. After the stent was deployed and the stenosis was reduced from 70% to 0%. The stent balloon and guidewire was removed. Final shots were taken in 3 orthogonal views, which showed no evidence of dissection, thrombosis or distal vessel perforation. His sheath was sutured into place and he was returned to the CPIU in stable condition. IV Integrilin was infusing. CONCLUSIONS: Successful intracoronary stent placement times two in the mid and proximal right coronary artery.The mid right coronary artery was a 3.0 mm x 23 mm Xience stent, stenosis reduced from 80% to 0%.The proximal right coronary artery was a 3.0 mm x 18 mm Xience, stenosis reduced from 70% to 0%.Both of these lesions were Type A lesions. He went from TIMI-2 flow at the beginning of the procedure to TIMI-3 flow at the completion of the procedure.  ANGIOGRAPHY: LEFT MAIN: The left main originates from the left coronary cusp. A large caliber vessel that is free of significant atherosclerotic disease. LEFT ANTERIOR DESCENDING: The left anterior descending (LAD) is a moderate caliber vessel, somewhat tortuous in its distal aspect wrapped around the apex. It gives rise to 2 small  diagonal branches. There is a focal napkin ring area in the midportion of the vessel, approximately 20% severity, but not flow-limiting. CIRCUMFLEX ARTERY: The circumflex artery bifurcates off the left main.It is a large caliber vessel giving rise to 3 marginal branches. This shows mild luminal irregularities but otherwise free of significant obstructive atherosclerotic disease. RIGHT CORONARY ARTERY: The right coronary artery is a moderate caliber vessel that originates from the right coronary cusp.There is a proximal 70% area of focal stenosis. In the proximal midportion, there is a long area of approximately 70% to 80% diffuse disease with minimal calcification. Distal vessel has mild luminal irregularities and an approximately 10% to 20% area of stenosis. The vessel distally divides into a posterior lateral branch, which was 100% occluded and a small posterior descending  branch, which is a widely patent. LEFT VENTRICLE: The left ventricle is of normal size and grossly normal systolic function, ejection fraction (EF) 50% to 55% with area of basilar inferior akinesia.     Patient Profile     Mr. Blunck is a 82 year old male with a past medical history of CAD s/p stenting to RCA in 2010, ICD implanted secondary to syncope with inducible VT, advanced dementia. He presented to the ED on 06/22/16 with chest pain. Troponin 0.04, planned for generator change scheduled for 06/27/16.   Assessment & Plan   1. Chest pain: Patient tells me that he does not remember having any chest pain, he is disoriented x 3. His EKG is non ischemic and troponin is negative. He does have a history of CAD, and says he remembers having chest pain in 2010 prior to his stenting to his RCA, hard to differentiate if this is true though.   Echo shows akinesis of basal mid inferior myocardium consistent with inferior MI in 2010.   2. History of CAD s/p PCI to RCA  3. Ischemic cardiomyopathy: EF of 35%, no prior  Echo to compare. Continue Coreg. Consider adding Arlyce Harman with low EF.   4. VT s/p ICD: EP plans to change him to a PPM on 06/27/16 after long discussion with family.   5. Right pleural effusion: s/p thoracentesis on 06/24/16.   Signed, Arbutus Leas, NP  06/25/2016, 11:26 AM   Patient seen and examined. Agree with assessment and plan. Currently patient has some confusion. No chest pain presently. Echo reveals EF 30 - 35% with inferior wall motion abnormality.  He is s/p thoracentesis of R pleural effusion with 1450 cc fluid removed. His recent chest painis most likely due to his effusion. It does not appear that he has any recent ischemic evaluation. Since it is 7 years since his PCI will schedule for a lexiscan nuclear study for ischemic assessment tommorow with plans for pacemaker on 06/27/16.   Troy Sine, MD, [facility] 06/25/2016 12:45 PM

## 2016-06-26 ENCOUNTER — Inpatient Hospital Stay (HOSPITAL_COMMUNITY): Payer: Medicare Other

## 2016-06-26 DIAGNOSIS — G309 Alzheimer's disease, unspecified: Secondary | ICD-10-CM

## 2016-06-26 DIAGNOSIS — J9601 Acute respiratory failure with hypoxia: Secondary | ICD-10-CM

## 2016-06-26 DIAGNOSIS — F0280 Dementia in other diseases classified elsewhere without behavioral disturbance: Secondary | ICD-10-CM

## 2016-06-26 DIAGNOSIS — Z515 Encounter for palliative care: Secondary | ICD-10-CM

## 2016-06-26 DIAGNOSIS — Z66 Do not resuscitate: Secondary | ICD-10-CM

## 2016-06-26 LAB — BASIC METABOLIC PANEL
Anion gap: 10 (ref 5–15)
BUN: 16 mg/dL (ref 6–20)
CHLORIDE: 104 mmol/L (ref 101–111)
CO2: 24 mmol/L (ref 22–32)
CREATININE: 0.92 mg/dL (ref 0.61–1.24)
Calcium: 8.5 mg/dL — ABNORMAL LOW (ref 8.9–10.3)
GFR calc Af Amer: >60 mL/min (ref >60)
GFR calc non Af Amer: >60 mL/min (ref >60)
GLUCOSE: 124 mg/dL — AB (ref 65–99)
Potassium: 4.3 mmol/L (ref 3.5–5.1)
Sodium: 138 mmol/L (ref 135–145)

## 2016-06-26 LAB — CBC
HEMATOCRIT: 34 % — AB (ref 39.0–52.0)
HEMOGLOBIN: 11 g/dL — AB (ref 13.0–17.0)
MCH: 28.5 pg (ref 26.0–34.0)
MCHC: 32.4 g/dL (ref 30.0–36.0)
MCV: 88.1 fL (ref 78.0–100.0)
Platelets: 256 10*3/uL (ref 150–400)
RBC: 3.86 MIL/uL — ABNORMAL LOW (ref 4.22–5.81)
RDW: 13.8 % (ref 11.5–15.5)
WBC: 8.1 10*3/uL (ref 4.0–10.5)

## 2016-06-26 MED ORDER — HYDROXYZINE HCL 25 MG PO TABS
25.0000 mg | ORAL_TABLET | ORAL | Status: DC | PRN
  Administered 2016-06-26: 25 mg via ORAL
  Filled 2016-06-26: qty 1

## 2016-06-26 MED ORDER — MORPHINE SULFATE (CONCENTRATE) 10 MG/0.5ML PO SOLN: 5.0000 mg | ORAL | Status: DC | PRN

## 2016-06-26 MED ORDER — SENNOSIDES-DOCUSATE SODIUM 8.6-50 MG PO TABS: 1.0000 | ORAL_TABLET | Freq: Every evening | ORAL | Status: DC | PRN

## 2016-06-26 NOTE — Progress Notes (Signed)
Patient ID: Erik Alvarez, male   DOB: 10-Sep-1929, 82 y.o.   MRN: [id number]      [address]       [place] [address]             [phone]                     LOS: 2 days   Subjective: Patient more alert then last night but still confused , wants to go home   Objective: Vital signs in last 24 hours: Patient Vitals for the past 24 hrs:  BP Temp Temp src Pulse Resp SpO2  06/26/16 1127 (!) 92/59 97.7 F (36.5 C) Axillary 77 18 96 %  06/26/16 0529 137/63 98.1 F (36.7 C) Oral 61 18 94 %  06/25/16 2040 112/68 98.1 F (36.7 C) Oral 68 18 91 %    Filed Weights   06/22/16 1250 06/22/16 1832  Weight: 147 lb (66.7 kg) 140 lb 10.5 oz (63.8 kg)    Hemodynamic parameters for last 24 hours:    Intake/Output from previous day: 10/31 0701 - 11/01 0700 In: 0  Out: 350 [Urine:350] Intake/Output this shift: Total I/O In: 0  Out: 400 [Urine:400]  Scheduled Meds: . aspirin  324 mg Oral Once  . aspirin EC  81 mg Oral Daily  . carvedilol  12.5 mg Oral BID WC  . enoxaparin (LOVENOX) injection  40 mg Subcutaneous Q24H  . memantine  2.5 mg Oral BID   Continuous Infusions:  PRN Meds:.acetaminophen, morphine CONCENTRATE, nitroGLYCERIN, ondansetron (ZOFRAN) IV, senna-docusate  General appearance: alert, cooperative and slowed mentation Neurologic: intact Heart: regular rate and rhythm, S1, S2 normal, no murmur, click, rub or gallop Lungs: diminished breath sounds RLL and RML Abdomen: soft, non-tender; bowel sounds normal; no masses,  no organomegaly Extremities: extremities normal, atraumatic, no cyanosis or edema and Homans sign is negative, no sign of DVT  Lab Results: CBC: Recent Labs  06/25/16 0220 06/26/16 0314  WBC 8.6 8.1  HGB 10.5* 11.0*  HCT 32.2* 34.0*  PLT 262 256   BMET:  Recent Labs  06/25/16 0220 06/26/16 0314  NA 136 138  K 3.9 4.3  CL 102 104  CO2 26 24  GLUCOSE 150* 124*  BUN 18 16  CREATININE 0.94 0.92  CALCIUM 8.3* 8.5*    PT/INR: No results for input(s): LABPROT, INR in the last 72 hours.   Radiology Dg Chest 2 View  Result Date: 06/26/2016 CLINICAL DATA:  Pleural effusion, shortness of Breath EXAM: CHEST  2 VIEW COMPARISON:  06/26/2016 FINDINGS: Cardiomediastinal silhouette is stable. There is large right pleural effusion with right basilar atelectasis or infiltrate. No pulmonary edema. Two leads cardiac pacemaker is unchanged in position. Left lung is clear. Atherosclerotic calcifications of thoracic aorta. IMPRESSION: Loss right pleural effusion with right basilar atelectasis or infiltrate. No pulmonary edema. Electronically Signed   By: Lahoma Crocker M.D.   On: 06/26/2016 13:05   Dg Chest Port 1 View  Result Date: 06/26/2016 CLINICAL DATA:  Acute respiratory failure with hypoxia, history hypertension, coronary artery disease, dementia EXAM: PORTABLE CHEST 1 VIEW COMPARISON:  Portable exam 0647 hours compared to 06/24/2016 FINDINGS: LEFT subclavian AICD leads project over RIGHT atrium and RIGHT ventricle, unchanged. Normal heart size, mediastinal contours, and pulmonary vascularity. Atherosclerotic calcification aorta. Large layered RIGHT pleural effusion opacifies the RIGHT hemithorax. Coming atelectasis of RIGHT lung. LEFT lung clear. No pneumothorax. Bones demineralized. IMPRESSION: Large layered RIGHT pleural effusion with associated  RIGHT lung atelectasis. Aortic atherosclerosis. Electronically Signed   By: Lavonia Dana M.D.   On: 06/26/2016 07:51     Assessment/Plan: Family not present , got message tonight , family and Palliative   care wished to proceed with Plurix cath . No at this late time for decission no OR time to do tomorrow. Suggest making patient npo tonight and see if IF can do tomorrow so patient can get home soon.   Erik Isaac MD 06/26/2016 5:51 PM

## 2016-06-26 NOTE — Consult Note (Signed)
Consultation Note Date: 06/26/2016   Patient Name: Erik Alvarez  DOB: 1929-11-01  MRN: [id number]  Age / Sex: 82 y.o., male  PCP: Reymundo Poll, MD Referring Physician: Blane Ohara McDiarmid, MD  Reason for Consultation: Establishing goals of care, Non pain symptom management, Pain control and Psychosocial/spiritual support  HPI/Patient Profile: 82 y.o. male  admitted on 06/22/2016 with a past medical history significant for CAD (s/p stent of RCA 2010), Dementia, HTN, VT(Pacemaker) presented with substernal chest pain radiating to right chest then left shoulder concerning for ACS in the setting of a history of CAD and hypertension.   The patient was diagnosed with a large pleural effusion on admission. He recently had a thoracentesis removing 1.5L of pleural fluid. He also has multiple left lung nodules on CT which are suspicious for metastatic cancer.      At baseline, patient is a resident at [facility], [place].  Per family he has had significant physical, functional and cognitive decline over the past month.   He and his family are faced with treatment options, advanced directive decisions and anticipatory care needs   Clinical Assessment and Goals of Care:  This NP Wadie Lessen reviewed medical records, received report from team, assessed the patient and then meet at the patient's bedside along with his woife and daughter Baker Janus and [place] conference call  to discuss diagnosis, prognosis, GOC, EOL wishes disposition and options.  A detailed discussion was had today regarding advanced directives.  Concepts specific to code status, artifical feeding and hydration, continued IV antibiotics and rehospitalization was had.  The difference between a aggressive medical intervention path  and a palliative comfort care path for this patient at this time was had.  Values and goals of care important to patient and family  were attempted to be elicited.  MOST form completed  Concept of Hospice and Palliative Care were discussed  Natural trajectory and expectations at EOL were discussed.  Questions and concerns addressed.   Family encouraged to call with questions or concerns.  PMT will continue to support holistically.    SUMMARY OF RECOMMENDATIONS   -focus is comfort an dignity -no further diagnostics  -open to placement of pluer-ex cath for comfort  -home with hospice services on discharge   Code Status/Advance Care Planning:  DNR   Symptom Management:   Dyspnea/Pain: Roxanol 5 mg po/sl every 1 hr prn  Palliative Prophylaxis:   Bowel Regimen, Frequent Pain Assessment and Oral Care  Additional Recommendations (Limitations, Scope, Preferences):  Avoid Hospitalization, Full Comfort Care, Minimize Medications, No Artificial Feeding, No Blood Transfusions, No Diagnostics, No Glucose Monitoring, No IV Antibiotics, No IV Fluids and No Lab Draws  Psycho-social/Spiritual:   Desire for further Chaplaincy support:no  Additional Recommendations: Education on Hospice  Prognosis:   < 3 months  Discharge Planning: Home with Hospice      Primary Diagnoses: Present on Admission: . Chest pain . ACS (acute coronary syndrome) ([place])   I have reviewed the medical record, interviewed the patient and family, and  examined the patient. The following aspects are pertinent.  Past Medical History:  Diagnosis Date  . Chest pain   . Coronary artery disease   . Dementia   . Dizziness   . Hypertension   . SOB (shortness of breath)   . Syncope   . VT (ventricular tachycardia) ([place])    Social History   Social History  . Marital status: Married    Spouse name: N/A  . Number of children: N/A  . Years of education: N/A   Social History Main Topics  . Smoking status: Former Research scientist (life sciences)  . Smokeless tobacco: Never Used  . Alcohol use Yes  . Drug use: No  . Sexual activity: Not Asked   Other  Topics Concern  . None   Social History Narrative  . None   Family History  Problem Relation Age of Onset  . Diabetes Brother    Scheduled Meds: . aspirin  324 mg Oral Once  . aspirin EC  81 mg Oral Daily  . carvedilol  12.5 mg Oral BID WC  . enoxaparin (LOVENOX) injection  40 mg Subcutaneous Q24H  . memantine  2.5 mg Oral BID  . polyethylene glycol  17 g Oral Daily  . simvastatin  40 mg Oral q1800   Continuous Infusions:  PRN Meds:.acetaminophen, nitroGLYCERIN, ondansetron (ZOFRAN) IV Medications Prior to Admission:  Prior to Admission medications   Medication Sig Start Date End Date Taking? Authorizing Provider  Acetaminophen (TYLENOL PO) Take 1 tablet by mouth 2 (two) times daily.   Yes Historical Provider, MD  aspirin EC 81 MG tablet Take 81 mg by mouth daily.   Yes Historical Provider, MD  carvedilol (COREG) 12.5 MG tablet Take 12.5 mg by mouth 2 (two) times daily with a meal.   Yes Historical Provider, MD  donepezil (ARICEPT) 10 MG tablet Take 10 mg by mouth at bedtime.   Yes Historical Provider, MD  ergocalciferol (VITAMIN D2) 50000 units capsule Take 50,000 Units by mouth every Wednesday.    Yes Historical Provider, MD  Liniments (SALONPAS EX) Apply 1 patch topically as needed (for pain).   Yes Historical Provider, MD  memantine (NAMENDA) 5 MG tablet Take 2.5 mg by mouth 2 (two) times daily.   Yes Historical Provider, MD  Menthol, Topical Analgesic, (BENGAY EX) Apply 1 application topically as needed (for pain).   Yes Historical Provider, MD  simvastatin (ZOCOR) 40 MG tablet Take 40 mg by mouth daily at 6 PM.   Yes Historical Provider, MD  traMADol (ULTRAM) 50 MG tablet Take 1 every 6 hours for pain not helped by Tylenol Patient not taking: Reported on 06/22/2016 06/02/16   Milton Ferguson, MD   No Known Allergies Review of Systems  Constitutional: Positive for fatigue.  Respiratory: Positive for shortness of breath.     Physical Exam  Constitutional: He appears  lethargic. He appears cachectic. He appears ill.  HENT:  Mouth/Throat: Mucous membranes are dry.  Cardiovascular: Normal rate, regular rhythm and normal heart sounds.   Pulmonary/Chest: He has decreased breath sounds.  Neurological: He appears lethargic.  Skin: Skin is warm and dry.    Vital Signs: BP 137/63 (BP Location: Right Arm)   Pulse 61   Temp 98.1 F (36.7 C) (Oral)   Resp 18   Ht '5\' 10"'$  (1.778 m)   Wt 63.8 kg (140 lb 10.5 oz)   SpO2 94%   BMI 20.18 kg/m  Pain Assessment: No/denies pain   Pain Score: Asleep   SpO2: SpO2:  94 % O2 Device:SpO2: 94 % O2 Flow Rate: .   IO: Intake/output summary:  Intake/Output Summary (Last 24 hours) at 06/26/16 1005 Last data filed at 06/26/16 0830  Gross per 24 hour  Intake                0 ml  Output              350 ml  Net             -350 ml    LBM: Last BM Date: 06/21/16 Baseline Weight: Weight: 66.7 kg (147 lb) Most recent weight: Weight: 63.8 kg (140 lb 10.5 oz)      Palliative Assessment/Data: 30 %    Discussed with Dr Reesa Chew and RN/bedside    Time In 0900 Time Out: 1015 Time Total: 75 min Greater than 50%  of this time was spent counseling and coordinating care related to the above assessment and plan.  Signed by: Wadie Lessen, NP   Please contact Palliative Medicine Team phone at [phone] for questions and concerns.  For individual provider: See Shea Evans

## 2016-06-26 NOTE — Significant Event (Signed)
Discussed with daughter over the phone concerning plurix placement for right-sided pleural effusion. Daughter wants to proceed with surgery prior to patient being d/c. -- Harriet [place], [place], PGY-1

## 2016-06-26 NOTE — Progress Notes (Signed)
Patient Name: Erik Alvarez Date of Encounter: 06/26/2016  Primary Cardiologist: Dr. [facility] Problem List     Active Problems:   Chest pain   ACS (acute coronary syndrome) (HCC)   Pleural effusion   Acute cognitive decline   Acute respiratory failure with hypoxia (HCC)     Subjective   Confused, pleasant. Does not appear anxious. Talking about past Presidents and is happy.   Inpatient Medications    Scheduled Meds: . aspirin  324 mg Oral Once  . aspirin EC  81 mg Oral Daily  . carvedilol  12.5 mg Oral BID WC  . enoxaparin (LOVENOX) injection  40 mg Subcutaneous Q24H  . memantine  2.5 mg Oral BID  . polyethylene glycol  17 g Oral Daily  . simvastatin  40 mg Oral q1800   Continuous Infusions:   PRN Meds: acetaminophen, nitroGLYCERIN, ondansetron (ZOFRAN) IV   Vital Signs    Vitals:   06/25/16 0500 06/25/16 1321 06/25/16 2040 06/26/16 0529  BP: 128/66 (!) 136/49 112/68 137/63  Pulse: 60 60 68 61  Resp: '18 18 18 18  '$ Temp: 97.7 F (36.5 C) 97.5 F (36.4 C) 98.1 F (36.7 C) 98.1 F (36.7 C)  TempSrc: Oral Oral Oral Oral  SpO2: 92% 97% 91% 94%  Weight:      Height:        Intake/Output Summary (Last 24 hours) at 06/26/16 0754 Last data filed at 06/26/16 0530  Gross per 24 hour  Intake                0 ml  Output              350 ml  Net             -350 ml   Filed Weights   06/22/16 1250 06/22/16 1832  Weight: 147 lb (66.7 kg) 140 lb 10.5 oz (63.8 kg)    Physical Exam    GEN: Well nourished, well developed, in no acute distress.  HEENT: Grossly normal.  Neck: Supple, no JVD, carotid bruits, or masses. Cardiac: RRR, no murmurs, rubs, or gallops. No clubbing, cyanosis, edema.  Radials/DP/PT 2+ and equal bilaterally.  Respiratory:  Respirations regular and unlabored, clear to auscultation bilaterally. GI: Soft, nontender, nondistended, BS + x 4. MS: no deformity or atrophy. Skin: warm and dry, no rash. Neuro:  Strength and sensation are  intact. Psych: AAOx3.  Normal affect.  Labs    CBC  Recent Labs  06/25/16 0220 06/26/16 0314  WBC 8.6 8.1  HGB 10.5* 11.0*  HCT 32.2* 34.0*  MCV 87.0 88.1  PLT 262 347   Basic Metabolic Panel  Recent Labs  06/25/16 0220 06/26/16 0314  NA 136 138  K 3.9 4.3  CL 102 104  CO2 26 24  GLUCOSE 150* 124*  BUN 18 16  CREATININE 0.94 0.92  CALCIUM 8.3* 8.5*   Liver Function Tests  Recent Labs  06/24/16 0856 06/24/16 1618  PROT  --  6.1*  ALBUMIN 2.2*  --    Cardiac Enzymes  Recent Labs  06/24/16 0306  TROPONINI 0.04*     Recent Labs  06/24/16 1618  CHOL 141     Telemetry    A paced - Personally Reviewed  ECG    A paced.  - Personally Reviewed  Radiology    Dg Chest Port 1 View  Result Date: 06/24/2016 CLINICAL DATA:  Status post right-sided thoracentesis. EXAM: PORTABLE CHEST 1 VIEW COMPARISON:  A chest CT 06/23/2016 FINDINGS: Decreased size of right pleural effusion following thoracentesis. No pneumothorax is identified. The left lung is clear. IMPRESSION: Status post thoracentesis with decreased size of right pleural effusion. No visualized pneumothorax. Electronically Signed   By: Ulyses Jarred M.D.   On: 06/24/2016 16:11    Cardiac Studies   Transthoracic Echocardiography 06/24/16 Study Conclusions  - Left ventricle: The cavity size was normal. Wall thickness was normal. Systolic function was moderately to severely reduced. The estimated ejection fraction was in the range of 30% to 35%. Akinesis of the basal-midinferior myocardium. Doppler parameters are consistent with abnormal left ventricular relaxation (grade 1 diastolic dysfunction). - Aortic valve: Moderately calcified annulus. - Pericardium, extracardiac: A trivial pericardial effusion was identified.   Left heart cath and PCI in Sept. 2010 [place]  GDOB:1930-06-05 KZS:[id number] ADM DATE:09/10/2010SEX:M ROOM:[facility] 05 PERCUTANEOUS TRANSLUMINAL INTERVENTION REPORT DATE OF PROCEDURE:05/05/2009 PRIMARY CARE PHYSICIAN:Dr. Lenna Sciara Cham[place] PRIMARY CARDIOLOGIST: Dr. Christie Nottingham INTERVENTIONIST:David Gearlean Alf, MD, [facility] INDICATIONS FOR INTERVENTION: Patient with new history of nonsustained ventricular tachycardia and abnormal stress Echocardiogram. PROCEDURE PERFORMED: Intracoronary stent placement times two in the native right coronary artery. EQUIPMENT USED: Guiding catheter was a 6 [place] JR4. Guidewire was a 0.014 All-Star wire. Stent the mid vessel was a 3.0 mm x 23 mm Xience drug-eluting stent. The proximal segment was a 3.0 mm x 18 mm Xience drug-eluting stent. MEDICATIONS GIVEN DURING THIS PROCEDURE: 1. Heparin 2500 units IV. 2.Integrilin double bolus and infusion. SUMMARY: Mr. Sarr is a 82 year old gentleman who was recently seen by Dr. Purcell Nails and had an abnormal stress echocardiogram. He had nonsustained ventricular tachycardia. The patient had a remote history of myocardial infarction in the early 90s and percutaneous transluminal coronary angioplasty at that time. Today, he underwent elective cardiac catheterization by Dr. Purcell Nails and was found to have 2 high-grade stenoses in the right coronary artery. The mid right coronary artery (RCA) had an 80% lesion and the proximal right coronary artery, a 70% stenosis. After diagnostic catheterization was completed, he was given adequate IV heparin to obtain an ACT of 254 seconds. A 6 [place] JR4 guide was placed coaxial to the ostium of the right coronary artery and the All-Star wire was used to cross the stenoses with minimal difficulty. I then directly stented the mid portion with a 3.0 mm x 23 mm Xience drug-eluting stent. This stent was deployed at 14 atmospheres for 1 minute and 30 seconds. After this was deployed, the  mid stenosis was reduced from 80% to 0%. I then removed the stent balloon over the guidewire and placed the proximal stent in the most stenotic portion of that area. This 3.0 mm x 18 mm Xience was then deployed also for 90 seconds for 16 atmospheres. During these inflations, he did not have any significant chest discomfort, but was extremely bradycardic. After the stent was deployed and the stenosis was reduced from 70% to 0%. The stent balloon and guidewire was removed. Final shots were taken in 3 orthogonal views, which showed no evidence of dissection, thrombosis or distal vessel perforation. His sheath was sutured into place and he was returned to the CPIU in stable condition. IV Integrilin was infusing. CONCLUSIONS: Successful intracoronary stent placement times two in the mid and proximal right coronary artery.The mid right coronary artery was a 3.0 mm x 23 mm Xience stent, stenosis reduced from 80% to 0%.The proximal right coronary artery was a 3.0 mm x 18 mm Xience, stenosis reduced from 70% to 0%.Both  of these lesions were Type A lesions. He went from TIMI-2 flow at the beginning of the procedure to TIMI-3 flow at the completion of the procedure.  ANGIOGRAPHY: LEFT MAIN: The left main originates from the left coronary cusp. A large caliber vessel that is free of significant atherosclerotic disease. LEFT ANTERIOR DESCENDING: The left anterior descending (LAD) is a moderate caliber vessel, somewhat tortuous in its distal aspect wrapped around the apex. It gives rise to 2 small diagonal branches. There is a focal napkin ring area in the midportion of the vessel, approximately 20% severity, but not flow-limiting. CIRCUMFLEX ARTERY: The circumflex artery bifurcates off the left main.It is a large caliber vessel giving rise to 3 marginal branches. This shows mild luminal irregularities but otherwise free of significant obstructive atherosclerotic disease. RIGHT CORONARY  ARTERY: The right coronary artery is a moderate caliber vessel that originates from the right coronary cusp.There is a proximal 70% area of focal stenosis. In the proximal midportion, there is a long area of approximately 70% to 80% diffuse disease with minimal calcification. Distal vessel has mild luminal irregularities and an approximately 10% to 20% area of stenosis. The vessel distally divides into a posterior lateral branch, which was 100% occluded and a small posterior descending branch, which is a widely patent. LEFT VENTRICLE: The left ventricle is of normal size and grossly normal systolic function, ejection fraction (EF) 50% to 55% with area of basilar inferior akinesia  Patient Profile  Mr. Canupp is a 82 year old male with a past medical history of CAD s/p stenting to RCA in 2010, ICD implanted secondary to syncope with inducible VT, advanced dementia. He presented to the ED on 06/22/16 with chest pain. Troponin 0.04, plan for change from ICD to pacer tomorrow.    Assessment & Plan    1. Chest pain: Patient tells me that he does not remember having any chest pain, he is disoriented x 3. His EKG is non ischemic and troponin is negative. He does have a history of CAD, and says he remembers having chest pain in 2010 prior to his stenting to his RCA, hard to differentiate if this is true though.   Echo shows akinesis of basal mid inferior myocardium consistent with inferior MI in 2010.   Had right pleural effusion - thoracentesis with 1450 ml out, likely that his chest pain was related to this. Nuclear study on hold pending palliative care meeting this morning at 9am with family.   2. History of CAD s/p PCI to RCA  3. Ischemic cardiomyopathy: EF of 35%, no prior Echo to compare. Continue Coreg. Consider adding Arlyce Harman with low EF.   4. VT s/p ICD: EP plans to change him to a PPM on 06/27/16 after long discussion with family.   5. Right pleural effusion: s/p thoracentesis on  06/24/16.    Signed, Arbutus Leas, NP  06/26/2016, 7:54 AM    Patient seen and examined. Agree with assessment and plan. No chest pain or dyspnea.  Pleasantly confused with significant dementia. Long discussion with wife and daughter after their meeting with palliative care.  Pt is a DNR. Comfort measures. Will cancel nuclear study and will sign off. Pt to meet with Dr. Shonna Chock concerning pacemaker.   Troy Sine, MD, [facility] 06/26/2016 10:13 AM

## 2016-06-26 NOTE — Progress Notes (Signed)
Family Medicine Teaching Service Daily Progress Note Intern Pager: [phone]  Patient name: Erik Alvarez [facility] Medical record number: [id number] Date of birth: [birth year] Age: 82 y.o. Gender: male  Primary Care Provider: Reymundo Poll, MD Consultants: Cardiology, Cardiothoracic Surgery Code Status: DNR   Pt Overview and Major Events to Date:  10/28: Patient admitted for ACS r/o  10/30: Thoracentesis, Echocardiogram 10/31: CVTS consulted for Pleurix cath placement 11/01: Palliative consult meeting to go over action plan and palliative options  Assessment and Plan: Erik Alvarez is a 82 y.o. male with a past medical history significant for CAD (s/p stent of RCA 2010), Dementia, HTN, VT(Pacemaker) presented with substernal chest pain radiating to right chest then left shoulder concerning for ACS in the setting of a history of CAD and hypertension.  #Chest Pain, acute Patient had Echo on 10/30 which showed an EF 30%-35% with grade 1 diastolic and akinesis of the basal mid inferior myocardium. Patient also had a thoracentesis with 1.5L red, cloudy fluid suggestive of malignancy with left lung nodules seen on imaging from 9/28. CT chest showed extensive pleural soft tissue thickening on the right. 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 Pain most likely 2/2 pleural effusion in the setting of probable bilateral lung cancer. --Follow up on pleural fluid analysis (light's criteria) --Continue simvastatin 40 mg daily --Continue 81 mg aspirin daily  --Tylenol 650g q4 prn --Zofran 4 mg q6 prn  #Cognitive Decline, Dementia, chronic Patient has history of dementia, previously able to perform most ADLs. Patient patient recently moved to [facility] retirement home in September. Patient is able to perform most ADLs on his own on a daily basis. However patient had a recent fall about 3 weeks ago and since that  fall has had a decline in ability to take care of himself.   --Pt family to meet with palliative concerning cardiology work up and Pleurx cath for pleural effusion --Continue memantine 2.5 mg twice a day --Discontinue donepezil 10 mg daily at bedtime  #Large to moderate pleural effusion, acute 1.5L of pleural fluid taken out post thoracentesis fluid analysis pending. Pulmonology believe this is likely 2/2 malignancy or paraneoplastic. CVTS to place Pleurx cath drain. CXR shows large R-sided pleural effusion and atelectasis. --Consulted CVTS, appreciate recs, await recs based on palliative meeting --Consider repeat chest CT now that we have a non obstructed right lung for further characterization Right lung parenchyma --Follow up on pleural fluid analysis (cytology, gram stain, LDH, protein) most likely secondary to malignancy  --Monitor vital signs and respiration status --Repeat CXR for reassessment of effusions  #Fall, acute Patient has had witnessed and possible unwitnessed falls since moving to his retirement home.These falls event could be related to new environment and unfamiliarity with new settings. Could also be secondary to physiologic decline. However, the way these falls have been described by family members, they are more consistent with syncopal episode. Our differential diagnosis for her syncopal episode would be vasovagal vs orthostatic vs arrhythmias.Pacemaker interrogation showed no arrhythmias and leads were stable. PT  Evaluation with recommendations for home health PT and 24hrsupervision. --Orthostatics vitals as needed   #Urinary retention:  Patient with hx of urinary retention due to BPH, currently with catheter in placed. Followed by urology, plan for Urolift. --Will keep foley in --Outpatient follow up with urology   #Hx of CAD (s/p stent of RCA (2010) --Continue aspirin 81 mg daily  #Hyperlipidemia --Continue simvastatin 40 mg daily  FEN/GI: Heart healthy  Diet  Prophylaxis: Lovenox    Disposition: Pending palliative meeting    Subjective:  No overnight events. Patient comfortable in bed. Denies, chest pain, dyspnea, nausea, vomiting. Patient seem confused and wants to know where his family is.  Objective: Temp:  [97.5 F (36.4 C)-98.1 F (36.7 C)] 98.1 F (36.7 C) (11/01 0529) Pulse Rate:  [60-68] 61 (11/01 0529) Resp:  [18] 18 (11/01 0529) BP: (112-137)/(49-68) 137/63 (11/01 0529) SpO2:  [91 %-97 %] 94 % (11/01 0529) Physical Exam: General: well nourished, well developed, in no acute distress with non-toxic appearance HEENT: normocephalic, atraumatic, moist mucous membranes Neck: supple, non-tender without lymphadenopathy CV: regular rate and rhythm without murmurs, rubs, or gallops Lungs: clear to auscultation bilaterally with normal work of breathing Abdomen: soft, non-tender, no masses or organomegaly palpable, normoactive bowel sounds Skin: warm, dry, no rashes or lesions, cap refill < 2 seconds Extremities: warm and well perfused, normal tone Psych: A&O to only self, normal affect  Laboratory:  Recent Labs Lab 06/24/16 0306 06/25/16 0220 06/26/16 0314  WBC 8.8 8.6 8.1  HGB 11.2* 10.5* 11.0*  HCT 33.4* 32.2* 34.0*  PLT 297 262 256    Recent Labs Lab 06/24/16 0306 06/24/16 1618 06/25/16 0220 06/26/16 0314  NA 136  --  136 138  K 4.1  --  3.9 4.3  CL 103  --  102 104  CO2 24  --  26 24  BUN 18  --  18 16  CREATININE 0.93  --  0.94 0.92  CALCIUM 8.5*  --  8.3* 8.5*  PROT  --  6.1*  --   --   GLUCOSE 157*  --  150* 124*   .  Imaging/Diagnostic Tests: Dg Chest 2 View Result Date: 06/22/2016 CLINICAL DATA:  Substernal chest pain, shortness of breath, syncope EXAM: CHEST  2 VIEW COMPARISON:  None. FINDINGS: Moderate to large right pleural effusion. Associated right lower lobe opacity, likely atelectasis. Left lung is clear.  No pneumothorax. The heart is normal in size.  Left subclavian ICD. Degenerative  changes of the visualized thoracolumbar spine. IMPRESSION: Moderate to large right pleural effusion. Associated right lower lobe opacity, likely atelectasis. Electronically Signed   By: Julian Hy M.D.   On: 06/22/2016 13:54    Ct Head Wo Contrast Result Date: 06/22/2016 CLINICAL DATA:  Acute mental status and low blood pressure. EXAM: CT HEAD WITHOUT CONTRAST TECHNIQUE: Contiguous axial images were obtained from the base of the skull through the vertex without intravenous contrast. COMPARISON:  None. FINDINGS: Brain: No subdural, epidural, or subarachnoid hemorrhage. Increased CSF near the left temporal horn, likely in an arachnoid cyst. Ventricles and sulci are prominent, likely due to atrophy. The cerebellum, brainstem, and basal cisterns are within normal limits. Scattered white matter changes most focal in the right frontal lobe on series 2, image 21. No acute cortical ischemia or infarct. No mass, mass effect, or midline shift. Vascular: Atherosclerotic changes seen in the intracranial carotid arteries. Skull: Normal. Negative for fracture or focal lesion. Sinuses/Orbits: No acute finding. Other: None. IMPRESSION: 1. No acute abnormality identified. Electronically Signed   By: Dorise Bullion III M.D   On: 06/22/2016 18:50   Transthoracic Echocardiography Result Date: 06/24/2016 Study Conclusions - Left ventricle: The cavity size was normal. Wall thickness was   normal. Systolic function was moderately to severely reduced. The   estimated ejection fraction was in the range of 30% to 35%.   Akinesis of the basal-midinferior myocardium. Doppler  parameters   are consistent with abnormal left ventricular relaxation (grade 1   diastolic dysfunction). - Aortic valve: Moderately calcified annulus. - Pericardium, extracardiac: A trivial pericardial effusion was   identified.  No results found.   Westmont Bing, DO 06/26/2016, 7:15 AM PGY-1, [place] Intern  pager: [phone], text pages welcome

## 2016-06-27 ENCOUNTER — Encounter (HOSPITAL_COMMUNITY): Payer: Self-pay

## 2016-06-27 ENCOUNTER — Ambulatory Visit (HOSPITAL_COMMUNITY): Admit: 2016-06-27 | Payer: Medicare Other | Admitting: Cardiology

## 2016-06-27 DIAGNOSIS — R41841 Cognitive communication deficit: Secondary | ICD-10-CM

## 2016-06-27 DIAGNOSIS — J90 Pleural effusion, not elsewhere classified: Secondary | ICD-10-CM

## 2016-06-27 LAB — BASIC METABOLIC PANEL
ANION GAP: 13 (ref 5–15)
BUN: 22 mg/dL — AB (ref 6–20)
CO2: 21 mmol/L — ABNORMAL LOW (ref 22–32)
Calcium: 8.7 mg/dL — ABNORMAL LOW (ref 8.9–10.3)
Chloride: 102 mmol/L (ref 101–111)
Creatinine, Ser: 0.97 mg/dL (ref 0.61–1.24)
Glucose, Bld: 124 mg/dL — ABNORMAL HIGH (ref 65–99)
POTASSIUM: 4.3 mmol/L (ref 3.5–5.1)
SODIUM: 136 mmol/L (ref 135–145)

## 2016-06-27 LAB — CBC
HCT: 35.8 % — ABNORMAL LOW (ref 39.0–52.0)
Hemoglobin: 11.8 g/dL — ABNORMAL LOW (ref 13.0–17.0)
MCH: 28.3 pg (ref 26.0–34.0)
MCHC: 33 g/dL (ref 30.0–36.0)
MCV: 85.9 fL (ref 78.0–100.0)
PLATELETS: 297 10*3/uL (ref 150–400)
RBC: 4.17 MIL/uL — AB (ref 4.22–5.81)
RDW: 13.3 % (ref 11.5–15.5)
WBC: 12.3 10*3/uL — AB (ref 4.0–10.5)

## 2016-06-27 SURGERY — PACEMAKER IMPLANT

## 2016-06-27 MED ORDER — ENOXAPARIN SODIUM 40 MG/0.4ML ~~LOC~~ SOLN: 40.0000 mg | SUBCUTANEOUS | Status: DC

## 2016-06-27 NOTE — Consult Note (Signed)
Chief Complaint: Patient was seen in consultation today for right Pleural PleurX catheter placement Chief Complaint  Patient presents with  . Chest Pain   at the request of Dr T McDiarmid  Referring Physician(s): Dr Darene Lamer McDiarmid  Supervising Physician: Markus Daft  Patient Status: [facility] - In-pt  History of Present Illness: Erik Alvarez is a 82 y.o. male   Dementia Admitted from [facility] 10/28 with CP and SOB CT 10/29: IMPRESSION: 1. Large right pleural effusion with mass effect, causing displacement of the mediastinum and heart to the left. There is extensive pleural soft tissue thickening on the right. 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 2. Multiple left lung nodules, suspicious for metastases. 3. Small left adrenal probable adenoma. 4. Sub-centimeter thyroid nodule(s) noted, too small to characterize, but most likely benign in the absence of known clinical risk factors for thyroid carcinoma. 5. Small pericardial effusion. 6. Aortic atherosclerosis.  Thoracentesis was performed 10/30 with CCM: 1.5L PLEURAL FLUID, RIGHT (SPECIMEN 1 OF 1, COLLECTED 06/24/16): ATYPICAL CELLS PRESENT.  Recurrent effusion per CXR 11/1 There is large right pleural effusion with right basilar atelectasis or infiltrate.  Pt has had Palliative consult; Hospice referral MD requesting Rt Pleural PleurX catheter placement  Imaging reviewed with Dr Anselm Pancoast He approves procedure   Past Medical History:  Diagnosis Date  . Chest pain   . Coronary artery disease   . Dementia   . Dizziness   . Hypertension   . SOB (shortness of breath)   . Syncope   . VT (ventricular tachycardia) ([place])     Past Surgical History:  Procedure Laterality Date  . INSERT / REPLACE / REMOVE PACEMAKER      Allergies: Review of patient's allergies indicates no known  allergies.  Medications: Prior to Admission medications   Medication Sig Start Date End Date Taking? Authorizing Provider  Acetaminophen (TYLENOL PO) Take 1 tablet by mouth 2 (two) times daily.   Yes Historical Provider, MD  aspirin EC 81 MG tablet Take 81 mg by mouth daily.   Yes Historical Provider, MD  carvedilol (COREG) 12.5 MG tablet Take 12.5 mg by mouth 2 (two) times daily with a meal.   Yes Historical Provider, MD  donepezil (ARICEPT) 10 MG tablet Take 10 mg by mouth at bedtime.   Yes Historical Provider, MD  ergocalciferol (VITAMIN D2) 50000 units capsule Take 50,000 Units by mouth every Wednesday.    Yes Historical Provider, MD  Liniments (SALONPAS EX) Apply 1 patch topically as needed (for pain).   Yes Historical Provider, MD  memantine (NAMENDA) 5 MG tablet Take 2.5 mg by mouth 2 (two) times daily.   Yes Historical Provider, MD  Menthol, Topical Analgesic, (BENGAY EX) Apply 1 application topically as needed (for pain).   Yes Historical Provider, MD  simvastatin (ZOCOR) 40 MG tablet Take 40 mg by mouth daily at 6 PM.   Yes Historical Provider, MD  traMADol (ULTRAM) 50 MG tablet Take 1 every 6 hours for pain not helped by Tylenol Patient not taking: Reported on 06/22/2016 06/02/16   Milton Ferguson, MD     Family History  Problem Relation Age of Onset  . Diabetes Brother     Social History   Social History  . Marital status: Married    Spouse name: N/A  . Number of children: N/A  . Years of education: N/A   Social History Main Topics  .  Smoking status: Former Research scientist (life sciences)  . Smokeless tobacco: Never Used  . Alcohol use Yes  . Drug use: No  . Sexual activity: Not Asked   Other Topics Concern  . None   Social History Narrative  . None     Review of Systems: A 12 point ROS discussed and pertinent positives are indicated in the HPI above.  All other systems are negative.  Review of Systems  Constitutional: Positive for activity change, appetite change and unexpected  weight change. Negative for fever.  Respiratory: Positive for cough and shortness of breath.   Cardiovascular: Positive for chest pain.  Neurological: Positive for weakness.  Psychiatric/Behavioral: Positive for confusion. Negative for behavioral problems.    Vital Signs: BP (!) 125/53 (BP Location: Left Arm)   Pulse 70   Temp 97.6 F (36.4 C) (Oral)   Resp 18   Ht '5\' 10"'$  (1.778 m)   Wt 140 lb 10.5 oz (63.8 kg)   SpO2 93%   BMI 20.18 kg/m   Physical Exam  Mallampati Score:  MD Evaluation Airway: WNL Heart: WNL Abdomen: WNL Chest/ Lungs: WNL ASA  Classification: 3 Mallampati/Airway Score: Two  Imaging: Dg Chest 2 View  Result Date: 06/26/2016 CLINICAL DATA:  Pleural effusion, shortness of Breath EXAM: CHEST  2 VIEW COMPARISON:  06/26/2016 FINDINGS: Cardiomediastinal silhouette is stable. There is large right pleural effusion with right basilar atelectasis or infiltrate. No pulmonary edema. Two leads cardiac pacemaker is unchanged in position. Left lung is clear. Atherosclerotic calcifications of thoracic aorta. IMPRESSION: Loss right pleural effusion with right basilar atelectasis or infiltrate. No pulmonary edema. Electronically Signed   By: Lahoma Crocker M.D.   On: 06/26/2016 13:05   Dg Chest 2 View  Result Date: 06/22/2016 CLINICAL DATA:  Substernal chest pain, shortness of breath, syncope EXAM: CHEST  2 VIEW COMPARISON:  None. FINDINGS: Moderate to large right pleural effusion. Associated right lower lobe opacity, likely atelectasis. Left lung is clear.  No pneumothorax. The heart is normal in size.  Left subclavian ICD. Degenerative changes of the visualized thoracolumbar spine. IMPRESSION: Moderate to large right pleural effusion. Associated right lower lobe opacity, likely atelectasis. Electronically Signed   By: Julian Hy M.D.   On: 06/22/2016 13:54   Dg Lumbar Spine Complete  Result Date: 06/02/2016 CLINICAL DATA:  Lumbago EXAM: LUMBAR SPINE - COMPLETE 4+ VIEW  COMPARISON:  None. FINDINGS: Frontal, lateral, spot lumbosacral lateral, and bilateral oblique views were obtained. There are 5 non-rib-bearing lumbar type vertebral bodies. There is mild rotatory scoliosis. There is no fracture or spondylolisthesis. There is moderately severe disc space narrowing at L4-5 and L5-S1. There is moderate disc space narrowing at L3-4. There is facet osteoarthritic change at L4-5 and L5-S1 bilaterally. There is extensive calcification in the aorta and iliac arteries. IMPRESSION: Osteoarthritic changes several levels. Mild scoliosis. No fracture or spondylolisthesis. There is extensive aortoiliac atherosclerosis. Electronically Signed   By: Lowella Grip III M.D.   On: 06/02/2016 09:55   Dg Pelvis 1-2 Views  Result Date: 06/02/2016 CLINICAL DATA:  Pain EXAM: PELVIS - 1-2 VIEW COMPARISON:  None. FINDINGS: There is no evidence of pelvic fracture or dislocation. Hip joints appear symmetric bilaterally. Sacroiliac joints appear symmetric bilaterally. No erosive change. IMPRESSION: No fracture or dislocation.  The joint spaces appear intact. Electronically Signed   By: Lowella Grip III M.D.   On: 06/02/2016 09:56   Ct Head Wo Contrast  Result Date: 06/22/2016 CLINICAL DATA:  Acute mental status and low blood pressure.  EXAM: CT HEAD WITHOUT CONTRAST TECHNIQUE: Contiguous axial images were obtained from the base of the skull through the vertex without intravenous contrast. COMPARISON:  None. FINDINGS: Brain: No subdural, epidural, or subarachnoid hemorrhage. Increased CSF near the left temporal horn, likely in an arachnoid cyst. Ventricles and sulci are prominent, likely due to atrophy. The cerebellum, brainstem, and basal cisterns are within normal limits. Scattered white matter changes most focal in the right frontal lobe on series 2, image 21. No acute cortical ischemia or infarct. No mass, mass effect, or midline shift. Vascular: Atherosclerotic changes seen in the  intracranial carotid arteries. Skull: Normal. Negative for fracture or focal lesion. Sinuses/Orbits: No acute finding. Other: None. IMPRESSION: 1. No acute abnormality identified. Electronically Signed   By: Dorise Bullion III M.D   On: 06/22/2016 18:50   Ct Chest W Contrast  Result Date: 06/23/2016 CLINICAL DATA:  Moderate to large-sized right pleural effusion on chest radiographs yesterday. Substernal chest pain, shortness of breath and syncope. EXAM: CT CHEST WITH CONTRAST TECHNIQUE: Multidetector CT imaging of the chest was performed during intravenous contrast administration. CONTRAST:  30m ISOVUE-300 IOPAMIDOL (ISOVUE-300) INJECTION 61% COMPARISON:  Chest radiographs obtained yesterday. FINDINGS: Cardiovascular: Dense aortic atherosclerotic calcifications. Normal sized heart. Small pericardial effusion with a maximum thickness of 7 mm. There are multiple epicardial fat nodules anteriorly on the right. The largest measures 1.3 x 0.6 cm on image number 134 of series 3. Mediastinum/Nodes: The mediastinum and heart are shifted to the left by a large right pleural effusion. No enlarged lymph nodes. 3 mm right lobe thyroid nodule and 2 mm left lobe thyroid nodule on image number 20 of series 3. Lungs/Pleura: Large right pleural effusion with peripheral, irregular, non continuous and contiguous areas of pleural soft tissue thickening. There is also marked right lung atelectasis. Multiple left lung nodules are demonstrated. The largest is in the left lower lobe, measuring 1.4 cm in maximum diameter on image number 110 of series 5. The next largest is in the left upper lobe, measuring 7 mm in maximum diameter on image number 31 of series 5. There is also 6 mm nodule in the posterior aspect of the left upper lobe on image number 36 and additional smaller nodules in the left lung. Upper Abdomen: There is a 9 mm oval, low density nodule in the left adrenal gland, measuring 22 Hounsfield units on image number 164 of  series 3. Musculoskeletal: Thoracic spine degenerative changes. No evidence of bony metastatic disease. IMPRESSION: 1. Large right pleural effusion with mass effect, causing displacement of the mediastinum and heart to the left. There is extensive pleural soft tissue thickening on the right. 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 2. Multiple left lung nodules, suspicious for metastases. 3. Small left adrenal probable adenoma. 4. Sub-centimeter thyroid nodule(s) noted, too small to characterize, but most likely benign in the absence of known clinical risk factors for thyroid carcinoma. 5. Small pericardial effusion. 6. Aortic atherosclerosis. Electronically Signed   By: SClaudie ReveringM.D.   On: 06/23/2016 15:20   Dg Chest Port 1 View  Result Date: 06/26/2016 CLINICAL DATA:  Acute respiratory failure with hypoxia, history hypertension, coronary artery disease, dementia EXAM: PORTABLE CHEST 1 VIEW COMPARISON:  Portable exam 0647 hours compared to 06/24/2016 FINDINGS: LEFT subclavian AICD leads project over RIGHT atrium and RIGHT ventricle, unchanged. Normal heart size, mediastinal contours, and pulmonary vascularity. Atherosclerotic calcification aorta. Large layered RIGHT pleural effusion  opacifies the RIGHT hemithorax. Coming atelectasis of RIGHT lung. LEFT lung clear. No pneumothorax. Bones demineralized. IMPRESSION: Large layered RIGHT pleural effusion with associated RIGHT lung atelectasis. Aortic atherosclerosis. Electronically Signed   By: Lavonia Dana M.D.   On: 06/26/2016 07:51   Dg Chest Port 1 View  Result Date: 06/24/2016 CLINICAL DATA:  Status post right-sided thoracentesis. EXAM: PORTABLE CHEST 1 VIEW COMPARISON:  A chest CT 06/23/2016 FINDINGS: Decreased size of right pleural effusion following thoracentesis. No pneumothorax is identified. The left lung is clear. IMPRESSION: Status post  thoracentesis with decreased size of right pleural effusion. No visualized pneumothorax. Electronically Signed   By: Ulyses Jarred M.D.   On: 06/24/2016 16:11    Labs:  CBC:  Recent Labs  06/24/16 0306 06/25/16 0220 06/26/16 0314 06/27/16 0916  WBC 8.8 8.6 8.1 12.3*  HGB 11.2* 10.5* 11.0* 11.8*  HCT 33.4* 32.2* 34.0* 35.8*  PLT 297 262 256 297    COAGS: No results for input(s): INR, APTT in the last 8760 hours.  BMP:  Recent Labs  06/24/16 0306 06/25/16 0220 06/26/16 0314 06/27/16 0916  NA 136 136 138 136  K 4.1 3.9 4.3 4.3  CL 103 102 104 102  CO2 '24 26 24 '$ 21*  GLUCOSE 157* 150* 124* 124*  BUN '18 18 16 '$ 22*  CALCIUM 8.5* 8.3* 8.5* 8.7*  CREATININE 0.93 0.94 0.92 0.97  GFRNONAA >60 >60 >60 >60  GFRAA >60 >60 >60 >60    LIVER FUNCTION TESTS:  Recent Labs  06/24/16 0856 06/24/16 1618  PROT  --  6.1*  ALBUMIN 2.2*  --     TUMOR MARKERS: No results for input(s): AFPTM, CEA, CA199, CHROMGRNA in the last 8760 hours.  Assessment and Plan:  Recurrent Rt pleural effusion Thora 10/30 1.5 L; + atypical cells Recurrent effusion per CXR CT does reveal pulmonary nodules Hospice referral Scheduled now for Rt pleural PleurX catheter placement Risks and Benefits discussed with the patient including bleeding, infection, damage to adjacent structures, pneumothorax, and sepsis. All of the patient's questions were answered, patient is agreeable to proceed. Consent signed and in chart.  Thank you for this interesting consult.  I greatly enjoyed meeting JAXXON NAEEM and look forward to participating in their care.  A copy of this report was sent to the requesting provider on this date.  Electronically Signed: Monia Sabal A 06/27/2016, 2:14 PM   I spent a total of 40 Minutes    in face to face in clinical consultation, greater than 50% of which was counseling/coordinating care for Rt PleurX catheter placement

## 2016-06-27 NOTE — Progress Notes (Signed)
Pt was noted to have no urine output today. Bladder scan performed and revealed 130cc, small amount of blood noted in catheter Family med teaching service was notified via [place] at  [phone]. Will continue to monitor pt.

## 2016-06-27 NOTE — Progress Notes (Signed)
Daily Progress Note   Patient Name: Erik Alvarez       Date: 06/27/2016 DOB: Feb 12, 1930  Age: 82 y.o. MRN#: [id number] Attending Physician: Blane Ohara McDiarmid, MD Primary Care Physician: Reymundo Poll, MD Admit Date: 06/22/2016  Reason for Consultation/Follow-up: Establishing goals of care, Non pain symptom management, Pain control and Psychosocial/spiritual support  Subjective: - family is comfortable with decision to focus on comfort, goal is home with hospice  -however family await placement of pleur-ex cath before discharge--understanding is cath will be place tomorrow by IR      -spoke with Dr Yisroel Ramming regarding need to coordinate with IR pleur-ex cath  Length of Stay: 3  Current Medications: Scheduled Meds:  . aspirin  324 mg Oral Once  . aspirin EC  81 mg Oral Daily  . carvedilol  12.5 mg Oral BID WC  . [START ON 06/28/2016] enoxaparin (LOVENOX) injection  40 mg Subcutaneous Q24H    Continuous Infusions:    PRN Meds: acetaminophen, hydrOXYzine, morphine CONCENTRATE, nitroGLYCERIN, ondansetron (ZOFRAN) IV, senna-docusate  Physical Exam  Constitutional: He appears lethargic. He appears cachectic. He appears ill.  Cardiovascular: Normal rate, regular rhythm and normal heart sounds.   Pulmonary/Chest: He has decreased breath sounds in the right lower field and the left lower field.  Abdominal: There is tenderness in the suprapubic area.  Genitourinary:  Genitourinary Comments: - c/o supra pubic pain, tender and firm  -nursing reports low urine output  Neurological: He appears lethargic.  Skin: Skin is warm and dry.            Vital Signs: BP (!) 125/53 (BP Location: Left Arm)   Pulse 70   Temp 97.6 F (36.4 C) (Oral)   Resp 18   Ht '5\' 10"'$  (1.778 m)   Wt 63.8 kg (140  lb 10.5 oz)   SpO2 93%   BMI 20.18 kg/m  SpO2: SpO2: 93 % O2 Device: O2 Device: Not Delivered O2 Flow Rate:    Intake/output summary:  Intake/Output Summary (Last 24 hours) at 06/27/16 1445 Last data filed at 06/27/16 1331  Gross per 24 hour  Intake                0 ml  Output              500 ml  Net             -  500 ml   LBM: Last BM Date: 06/27/16 Baseline Weight: Weight: 66.7 kg (147 lb) Most recent weight: Weight: 63.8 kg (140 lb 10.5 oz)       Palliative Assessment/Data: 30 % at best      Patient Active Problem List   Diagnosis Date Noted  . DNR (do not resuscitate) 06/26/2016  . Palliative care by specialist 06/26/2016  . Acute respiratory failure with hypoxia ([place])   . Pleural effusion   . Acute cognitive decline   . Chest pain 06/22/2016  . ACS (acute coronary syndrome) ([place]) 06/22/2016    Palliative Care Assessment & Plan    Assessment: -focus is comfort and dignity, home with hospice  F/U visit this afternoon at family request  Erik Alvarez is c/o super-pubic and per nursing foley has not been draining effectively.  Attempted to flush without success.  Foley cath replaced and irrigated for return of 150 ss blood tinged urine, clots noted.  Daugher tells me this has been a problem in the past.  He has a chronic indwelling foley  Will need close observation.  Family medicine team made aware of above  Code Status:    Code Status Orders        Start     Ordered   06/22/16 1856  Do not attempt resuscitation (DNR)  Continuous    Question Answer Comment  In the event of cardiac or respiratory ARREST Do not call a "code blue"   In the event of cardiac or respiratory ARREST Do not perform Intubation, CPR, defibrillation or ACLS   In the event of cardiac or respiratory ARREST Use medication by any route, position, wound care, and other measures to relive pain and suffering. May use oxygen, suction and manual treatment of airway obstruction as needed for  comfort.      06/22/16 1855    Code Status History    Date Active Date Inactive Code Status Order ID Comments User Context   This patient has a current code status but no historical code status.       Prognosis:   < 6 weeks  Discharge Planning:  Home with Hospice  Care plan was discussed with Dr Yisroel Ramming and   Thank you for allowing the Palliative Medicine Team to assist in the care of this patient.   Time In:  0800 Time Out:  0835 Total Time 35 min Prolonged Time Billed  no       Greater than 50%  of this time was spent counseling and coordinating care related to the above assessment and plan.  Wadie Lessen, NP  Please contact Palliative Medicine Team phone at [phone] for questions and concerns.

## 2016-06-27 NOTE — Care Management Note (Signed)
Case Management Note Marvetta Gibbons RN, BSN Unit 2W-Case Manager [phone]  Patient Details  Name: Erik Alvarez MRN: [id number] Date of Birth: 12-20-1929  Subjective/Objective:  Pt admitted with chest pain/pl efussion                   Action/Plan: PTA pt lived at home with spouse at [place] apartment at [facility]- spoke with pt and daughter at bedside- per conversation plan would be to return to pt's living situation at [facility] where he lives with wife in an apartment- pt has caregivers (private duty) 12 hrs/day- and also has Bright with [facility] along with inhouse therapy with [facility] per daughter- at this time plan would be to continue with the current set up- unless a high level of care is recommended- daughter would consider STSNF if needed. - list provided to daughter of private duty agencies for reference per request. CM to follow for any further d/c needs.   Expected Discharge Date:                 Expected Discharge Plan:  [place] Referral:  Clinical Social Work  Discharge planning Services  CM Consult  Post Acute Care Choice:  Hospice Choice offered to:  Patient, Adult Children  DME Arranged:    DME Agency:     [place]:    [place]:  Hospice and [place] of Service:  Completed, signed off  If discussed at [place] of Stay Meetings, dates discussed:  06/27/16  Discharge Disposition: Home with Hospice   Additional Comments:  06/27/16- 1000- Marvetta Gibbons RN, CM- referral for home hospice called to [facility] as per conversation with daughter  06/26/16- 1645- Tiandra Swoveland RN, CM- referral received for Home Hospice- notice plans for PleurX Cath placement possible tomorrow- no family present at bedside- call made to daughter Baker Janus to offer choice for [facility] in  [facility]- per conversation with daughter she would prefer to use [facility]- and is interested in hospital bed for home.  Will call referral into [facility] in the AM. Discussed transportation back home private vehicle vs EMS- daughter to think on it and decide closer to discharge.   Dawayne Patricia, RN 06/27/2016, 9:59 AM

## 2016-06-27 NOTE — Progress Notes (Signed)
Notified by Marvetta Gibbons,  CMRN of family request for Hospice and [place] services at home after discharge. Chart and patient information currently under review to confirm hospice eligibility.  Spoke with patient's daughter Irma Newness  at bedside to initiate education related to hospice philosophy, services and team approach to care. Family verbalized understanding of the information provided. Per discussion  Discharge date undetermined at this time pending placement of Pleurex drain.  Daughter also uncertain at this time whether patient will transport via car or ambulance. Will make determination on that after drain is placed.  Please send signed completed DNR form home with patient.  Patient will need prescriptions for discharge comfort medications.  DME needs discussed and family requested hospital bed with 1/2 rails and w/c ; requesting delivery to [facility] tomorrow morning before noon.  [facility] equipment manager Jewel Ysidro Evert notified and will contact [place] to arrange delivery to the home.  The home address has been verified and is correct in the chart; daughter Baker Janus is the family member to be contacted to arrange time of delivery.  [facility] Referral Center aware of the above.  Completed discharge summary will need to be faxed to [facility] at [phone] when final.  Please notify [facility] when patient is ready to leave unit at discharge-call [phone].  [facility] information and contact numbers have been given to Irma Newness  during visit.   Thank you for this referral.   Please call with any questions.  Thank You,  Mickie Kay, [facility] Liaison  [phone]

## 2016-06-27 NOTE — Progress Notes (Signed)
Family Medicine Teaching Service Daily Progress Note Intern Pager: [phone]  Patient name: Erik Alvarez [facility] Medical record number: [id number] Date of birth: 1930/06/01 Age: 82 y.o. Gender: male  Primary Care Provider: Reymundo Poll, MD Consultants: Cardiology, Cardiothoracic Surgery Code Status: DNR   Pt Overview and Major Events to Date:  10/28: Patient admitted for ACS r/o  10/30: Thoracentesis, Echocardiogram 10/31: CVTS consulted for Pleurix cath placement 11/01: Palliative consult meeting to go over action plan and palliative options 11/02: IR consulted for PleurX placement   Assessment and Plan: Erik Alvarez is a 82 y.o. male with a past medical history significant for CAD (s/p stent of RCA 2010), Dementia, HTN, VT(Pacemaker) presented with substernal chest pain radiating to right chest then left shoulder concerning for ACS in the setting of a history of CAD and hypertension.  #Chest Pain, Resolved Patient had Echo on 10/30 which showed an EF 30%-35% with grade 1 diastolic and akinesis of the basal mid inferior myocardium. Patient also had a thoracentesis with 1.5L red, cloudy fluid suggestive of malignancy with left lung nodules seen on imaging from 9/28. CT chest showed extensive pleural soft tissue thickening on the right. 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 Pain most likely 2/2 pleural effusion in the setting of probable bilateral lung cancer. --Follow up on pleural fluid analysis (light's criteria) --Continue 81 mg aspirin QD --Tylenol 650g q4 prn --Zofran 4 mg q6 prn  #Cognitive Decline, Dementia, Chronic Patient has history of dementia, previously able to perform most ADLs. Patient patient recently moved to [facility] in September. Patient is able to perform most ADLs on his own on a daily basis. However patient had a recent fall about 3 weeks ago and since  that fall has had a decline in ability to take care of himself. Discontinued memantine and donepezil for palliative measures.  #Large to Moderate Pleural Effusion, Acute 1.5L of pleural fluid taken out post thoracentesis fluid analysis pending. Pulmonology believe this is likely 2/2 malignancy or paraneoplastic. IR to place Pleurx cath drain. CXR on 11/1 shows large R-sided pleural effusion and atelectasis. --Consulted IR for PleurX, appreciate recs --Consider repeat chest CT now that we have a non obstructed right lung for further characterization right lung parenchyma --Follow up on pleural fluid analysis (cytology, gram stain, LDH, protein) most likely secondary to malignancy  --Monitor vital signs and respiration status  #Fall, acute Patient has had witnessed and possible unwitnessed falls since moving to his retirement home.These falls event could be related to new environment and unfamiliarity with new settings. Could also be secondary to physiologic decline. However, the way these falls have been described by family members, they are more consistent with syncopal episode. Our differential diagnosis for her syncopal episode would be vasovagal vs orthostatic vs arrhythmias.Pacemaker interrogation showed no arrhythmias and leads were stable. PT  Evaluation with recommendations for home health PT and 24hrsupervision. --Orthostatics vitals as needed   #Urinary retention:  Patient with hx of urinary retention due to BPH, currently with catheter in placed. Followed by urology, plan for Urolift. --Will keep foley in --Outpatient follow up with urology   #Hx of CAD (s/p stent of RCA (2010) --Continue aspirin 81 mg daily  #Hyperlipidemia Discontinue simvastatin 40 mg daily due to palliative measures.  FEN/GI: Heart healthy Diet, NPO MN for possible PleurX on 11/3 Prophylaxis: Lovenox    Disposition: Continue palliative measures. IR consulted. Possible PleurX on 11/3.  Subjective:  No  overnight events. Patient comfortable in bed. Denies, chest pain, dyspnea, nausea, vomiting. Patient asking where daughter is. Patient fell asleep during conversation.  Objective: Temp:  [97.7 F (36.5 C)-98.1 F (36.7 C)] 98 F (36.7 C) (11/02 0342) Pulse Rate:  [62-77] 71 (11/02 0342) Resp:  [18-20] 20 (11/02 0342) BP: (92-136)/(56-64) 136/64 (11/02 0342) SpO2:  [92 %-96 %] 95 % (11/02 0342) Physical Exam: General: well nourished, well developed, in no acute distress with non-toxic appearance HEENT: normocephalic, atraumatic, moist mucous membranes Neck: supple, non-tender without lymphadenopathy CV: regular rate and rhythm without murmurs, rubs, or gallops Lungs: clear to auscultation bilaterally with normal work of breathing Abdomen: soft, non-tender, no masses or organomegaly palpable, normoactive bowel sounds Skin: warm, dry, no rashes or lesions, cap refill < 2 seconds Extremities: warm and well perfused, normal tone Psych: A&O to only self, normal affect  Laboratory:  Recent Labs Lab 06/24/16 0306 06/25/16 0220 06/26/16 0314  WBC 8.8 8.6 8.1  HGB 11.2* 10.5* 11.0*  HCT 33.4* 32.2* 34.0*  PLT 297 262 256    Recent Labs Lab 06/24/16 0306 06/24/16 1618 06/25/16 0220 06/26/16 0314  NA 136  --  136 138  K 4.1  --  3.9 4.3  CL 103  --  102 104  CO2 24  --  26 24  BUN 18  --  18 16  CREATININE 0.93  --  0.94 0.92  CALCIUM 8.5*  --  8.3* 8.5*  PROT  --  6.1*  --   --   GLUCOSE 157*  --  150* 124*   .  Imaging/Diagnostic Tests: Dg Chest 2 View Result Date: 06/22/2016 CLINICAL DATA:  Substernal chest pain, shortness of breath, syncope EXAM: CHEST  2 VIEW COMPARISON:  None. FINDINGS: Moderate to large right pleural effusion. Associated right lower lobe opacity, likely atelectasis. Left lung is clear.  No pneumothorax. The heart is normal in size.  Left subclavian ICD. Degenerative changes of the visualized thoracolumbar spine. IMPRESSION: Moderate to large right  pleural effusion. Associated right lower lobe opacity, likely atelectasis. Electronically Signed   By: Julian Hy M.D.   On: 06/22/2016 13:54    Ct Head Wo Contrast Result Date: 06/22/2016 CLINICAL DATA:  Acute mental status and low blood pressure. EXAM: CT HEAD WITHOUT CONTRAST TECHNIQUE: Contiguous axial images were obtained from the base of the skull through the vertex without intravenous contrast. COMPARISON:  None. FINDINGS: Brain: No subdural, epidural, or subarachnoid hemorrhage. Increased CSF near the left temporal horn, likely in an arachnoid cyst. Ventricles and sulci are prominent, likely due to atrophy. The cerebellum, brainstem, and basal cisterns are within normal limits. Scattered white matter changes most focal in the right frontal lobe on series 2, image 21. No acute cortical ischemia or infarct. No mass, mass effect, or midline shift. Vascular: Atherosclerotic changes seen in the intracranial carotid arteries. Skull: Normal. Negative for fracture or focal lesion. Sinuses/Orbits: No acute finding. Other: None. IMPRESSION: 1. No acute abnormality identified. Electronically Signed   By: Dorise Bullion III M.D   On: 06/22/2016 18:50   Transthoracic Echocardiography Result Date: 06/24/2016 Study Conclusions - Left ventricle: The cavity size was normal. Wall thickness was   normal. Systolic function was moderately to severely reduced. The   estimated ejection fraction was in the range of 30% to 35%.   Akinesis of the basal-midinferior myocardium. Doppler parameters   are consistent with abnormal left ventricular relaxation (grade 1   diastolic dysfunction). - Aortic  valve: Moderately calcified annulus. - Pericardium, extracardiac: A trivial pericardial effusion was   identified.  Dg Chest 2 View  Result Date: 06/26/2016 CLINICAL DATA:  Pleural effusion, shortness of Breath EXAM: CHEST  2 VIEW COMPARISON:  06/26/2016 FINDINGS: Cardiomediastinal silhouette is stable. There is  large right pleural effusion with right basilar atelectasis or infiltrate. No pulmonary edema. Two leads cardiac pacemaker is unchanged in position. Left lung is clear. Atherosclerotic calcifications of thoracic aorta. IMPRESSION: Loss right pleural effusion with right basilar atelectasis or infiltrate. No pulmonary edema. Electronically Signed   By: Lahoma Crocker M.D.   On: 06/26/2016 13:05     [place] Bing, DO 06/27/2016, 7:16 AM PGY-1, [place] Intern pager: [phone], text pages welcome

## 2016-06-27 NOTE — Significant Event (Signed)
Discussed with Wadie Lessen, NP with palliative care concerning PleurX placement for right sided pleural effusion. [facility] have been following and considered performing procedure. Daughter consented on 11/1. [facility] sent message to palliative stating they cannot perform the procedure today on 11/2 and would recommend IR place the drain. Will contact IR for drain placement. -- Harriet Butte, [place], PGY-1

## 2016-06-27 NOTE — Progress Notes (Signed)
Name: Erik Alvarez MRN: [id number] DOB: Jun 24, 1930    ADMISSION DATE:  06/22/2016 CONSULTATION DATE:  06/24/16  REFERRING MD :  Dr. 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  Found to have a large R pleural effusion s/p thora on 10/30 with 1450 of rust colored fluid removed.  Cytology with atypical cells but no malignant cells identified.      SUBJECTIVE:  Pt states "I am ready to go home - its 10 o'clock at night"  VITAL SIGNS: Temp:  [97.7 F (36.5 C)-98.1 F (36.7 C)] 97.7 F (36.5 C) (11/02 0900) Pulse Rate:  [62-99] 99 (11/02 0900) Resp:  [18-20] 20 (11/02 0900) BP: (92-136)/(36-75) 116/36 (11/02 0900) SpO2:  [92 %-97 %] 97 % (11/02 0900)  PHYSICAL EXAMINATION: General:  Thin elderly male in NAD Neuro:  Awake, alert, MAE, not oriented, pleasant confusion  HEENT:  Pupils 48m, good dentition, no JVD Cardiovascular:  s1s2 rrr, distant tones Lungs:  Non-labored, diminished on L, clear on R  Abdomen:  Soft, non-tender, bsx4 active  Musculoskeletal:  No acute deformities Skin:  Warm/dry, no edema rashes or lesions   Recent Labs Lab 06/25/16 0220 06/26/16 0314 06/27/16 0916  NA 136 138 136  K 3.9 4.3 4.3  CL 102 104 102  CO2 26 24 21*  BUN 18 16 22*  CREATININE 0.94 0.92 0.97  GLUCOSE 150* 124* 124*    Recent Labs Lab 06/25/16 0220 06/26/16 0314 06/27/16 0916  HGB 10.5* 11.0* 11.8*  HCT 32.2* 34.0* 35.8*  WBC 8.6 8.1 12.3*  PLT 262 256 297   Dg Chest 2 View  Result Date: 06/26/2016 CLINICAL DATA:  Pleural effusion, shortness of Breath EXAM: CHEST  2 VIEW COMPARISON:  06/26/2016 FINDINGS: Cardiomediastinal silhouette is stable. There is large right pleural  effusion with right basilar atelectasis or infiltrate. No pulmonary edema. Two leads cardiac pacemaker is unchanged in position. Left lung is clear. Atherosclerotic calcifications of thoracic aorta. IMPRESSION: Loss right pleural effusion with right basilar atelectasis or infiltrate. No pulmonary edema. Electronically Signed   By: LLahoma CrockerM.D.   On: 06/26/2016 13:05   Dg Chest Port 1 View  Result Date: 06/26/2016 CLINICAL DATA:  Acute respiratory failure with hypoxia, history hypertension, coronary artery disease, dementia EXAM: PORTABLE CHEST 1 VIEW COMPARISON:  Portable exam 0647 hours compared to 06/24/2016 FINDINGS: LEFT subclavian AICD leads project over RIGHT atrium and RIGHT ventricle, unchanged. Normal heart size, mediastinal contours, and pulmonary vascularity. Atherosclerotic calcification aorta. Large layered RIGHT pleural effusion opacifies the RIGHT hemithorax. Coming atelectasis of RIGHT lung. LEFT lung clear. No pneumothorax. Bones demineralized. IMPRESSION: Large layered RIGHT pleural effusion with associated RIGHT lung atelectasis. Aortic atherosclerosis. Electronically Signed   By: MLavonia DanaM.D.   On: 06/26/2016 07:51     SIGNIFICANT EVENTS  10/28  Admit with chest pain, CXR + for mod to large R pleural effusion  10/30  PCCM consulted for effusion   STUDIES:  CT Head 10/28 >> no acute abnormalities  CT Chest 10/29 >> large R pleural effusion with mass effect, causing displacement of the mediastinum to the left, extensive subpleural thickening on the R,  multiple left nodules, small left adrenal adenoma, small thyroid nodules, small pericardial effusion   Pleural Fluid 10/30  LDH 2664 / serum 221 > 12.1  Protein 3.6 / serum 6.1 > 0.6 WBC 464 Culture >>  GS >> wbc, predominantly mononuclear, no organisims  ASSESSMENT / PLAN:  Exudative Right Pleural Effusion - admitted with complaints of left chest pain, large right effusion found on CXR, hx of recent fall but not on  anticoagulation.  Effusion does not complicated by CT or [place] assessment.  Pleural thickening noted.  DDx includes parapneumonic effusion / infection, hemothorax post fall and malignancy.  Given findings of left nodules / masses & pleural thickening raises concern for malignancy.  Cytology with atypical cells but no malignant cells identified.   Plan: Follow pleural fluid cultures Cytology negative, does not necessarily eliminate malignancy.   Trend CXR to assess rate of re-accumulation Consider repeat thoracentesis for cytology, will discuss plan of care with Attending MD   Goals of Care - Patient is DNR.  Prior discussion with daughter Baker Janus) >  She would like to wait until the pathology is finalized before discharge in the event he would need PleurX for comfort.  Family has discussed that they would not want to pursue treatment (chemo / XRT) if this in fact a malignancy.  They are open to PleurX to promote comfort if needed.  The concept of Palliative Care has been introduced to the daughter.  He probably would qualify for palliative support based solely on his dementia.  They would like to meet to discuss outpatient plan of care / support options.  He currently has an aide that assists in his care.     [name], NP-C [facility] Pgr: [phone] or if no answer [phone] 06/27/2016, 10:50 AM  Exudate Rt pleural effusion.  Lymphocyte predominant and atypical cells on cytology.  Arrange for hospice care and palliative placement of pleurx catheter.  PCCM will sign off.  Chesley Mires, MD [facility] Pulmonary/Critical Care 06/27/2016, 3:25 PM Pager:  [phone] After 3pm call: [phone]

## 2016-06-27 NOTE — Progress Notes (Signed)
Patient complaining of abdominal pain and pressure. No urine in foley bag (family med teaching service is aware). New order to replace foley and irrigate foley cath was obtained from [facility], NP. foley was removed and large blood clot was noted at foley tip. New foley cath placed by Caro Hight, RN nursing instructor and her student nurse, Clearnce Sorrel. Bladder irrigation was performed by this RN with 79m of saline. Dark red urine was noted along with small blood clots. Pt appeared to be more comfortable. Will continue to monitor pt.

## 2016-06-28 ENCOUNTER — Inpatient Hospital Stay (HOSPITAL_COMMUNITY): Payer: Medicare Other

## 2016-06-28 HISTORY — PX: IR GENERIC HISTORICAL: IMG1180011

## 2016-06-28 LAB — APTT: APTT: 35 s (ref 24–36)

## 2016-06-28 LAB — CBC
HCT: 34.8 % — ABNORMAL LOW (ref 39.0–52.0)
Hemoglobin: 11.4 g/dL — ABNORMAL LOW (ref 13.0–17.0)
MCH: 27.9 pg (ref 26.0–34.0)
MCHC: 32.8 g/dL (ref 30.0–36.0)
MCV: 85.1 fL (ref 78.0–100.0)
PLATELETS: 288 10*3/uL (ref 150–400)
RBC: 4.09 MIL/uL — AB (ref 4.22–5.81)
RDW: 13.5 % (ref 11.5–15.5)
WBC: 10.8 10*3/uL — AB (ref 4.0–10.5)

## 2016-06-28 LAB — PROTIME-INR
INR: 1.16
PROTHROMBIN TIME: 14.9 s (ref 11.4–15.2)

## 2016-06-28 MED ORDER — CEFAZOLIN SODIUM-DEXTROSE 2-4 GM/100ML-% IV SOLN
2.0000 g | INTRAVENOUS | Status: DC
  Filled 2016-06-28: qty 100

## 2016-06-28 MED ORDER — MIDAZOLAM HCL 2 MG/2ML IJ SOLN
INTRAMUSCULAR | Status: AC | PRN
  Administered 2016-06-28: 0.5 mg via INTRAVENOUS

## 2016-06-28 MED ORDER — ONDANSETRON 4 MG PO TBDP: 4.0000 mg | ORAL_TABLET | Freq: Three times a day (TID) | ORAL | 0 refills | Status: AC | PRN

## 2016-06-28 MED ORDER — MIDAZOLAM HCL 2 MG/2ML IJ SOLN
INTRAMUSCULAR | Status: AC
  Filled 2016-06-28: qty 2

## 2016-06-28 MED ORDER — LIDOCAINE HCL 1 % IJ SOLN
INTRAMUSCULAR | Status: AC
  Filled 2016-06-28: qty 20

## 2016-06-28 MED ORDER — MORPHINE SULFATE (CONCENTRATE) 10 MG/0.5ML PO SOLN: 5.0000 mg | ORAL | 0 refills | Status: AC | PRN

## 2016-06-28 MED ORDER — LIDOCAINE HCL 1 % IJ SOLN
INTRAMUSCULAR | Status: AC | PRN
  Administered 2016-06-28: 5 mL

## 2016-06-28 MED ORDER — SENNOSIDES-DOCUSATE SODIUM 8.6-50 MG PO TABS: 1.0000 | ORAL_TABLET | Freq: Every evening | ORAL | 0 refills | Status: AC | PRN

## 2016-06-28 MED ORDER — FENTANYL CITRATE (PF) 100 MCG/2ML IJ SOLN
INTRAMUSCULAR | Status: AC | PRN
  Administered 2016-06-28: 25 ug via INTRAVENOUS

## 2016-06-28 MED ORDER — CEFAZOLIN SODIUM-DEXTROSE 2-4 GM/100ML-% IV SOLN
INTRAVENOUS | Status: AC
  Administered 2016-06-28: 2000 mg
  Filled 2016-06-28: qty 100

## 2016-06-28 MED ORDER — FENTANYL CITRATE (PF) 100 MCG/2ML IJ SOLN
INTRAMUSCULAR | Status: AC
  Filled 2016-06-28: qty 2

## 2016-06-28 NOTE — Care Management Important Message (Signed)
Important Message  Patient Details  Name: Erik Alvarez MRN: [id number] Date of Birth: [birth year]   Medicare Important Message Given:  Yes    [name] Abena 06/28/2016, 10:27 AM

## 2016-06-28 NOTE — Progress Notes (Signed)
Contacted [place], Collie Siad for scheduled dc home today with Hospice. States they have pt arranged for visit this evening and DME has been delivered. Contacted attending to complete Pleurx drain kit application. Unit RN will send pt home with a box of drain bottles. Will fax application once complete. Jonnie Finner RN CCM Case Mgmt phone [phone]

## 2016-06-28 NOTE — Procedures (Signed)
Large malignant rt pleural effusion  S/p RT PLEURX INSERTION  1.3 L RT THORACENTESIS AFTER INSERTION  No comp Stable Full report in PACS

## 2016-06-28 NOTE — Progress Notes (Signed)
Pt has been picked up by [facility] and his on his way to his apartement. Pt daughter was notified and she stated that she is at pt apartement with the hospice nurse waiting for the pt to arrive.  Ferdinand Lango, RN

## 2016-06-28 NOTE — Sedation Documentation (Signed)
Patient is resting comfortably. 

## 2016-06-28 NOTE — Discharge Instructions (Signed)
Erik Alvarez was admitted for chest pain and was found to have fluid in is right lung which was causing discomfort for him. A PleurX drain was inserted to help prevent recurrent fluid accumulation and prevent future discomfort and hospital visits. Palliative care was consulted and helped direct his care. Erik Alvarez should return back to his home with his drain and a foley to help with his urinary retention. Palliative care will continue to see him at his home and provide care from this point on.   It was a pleasure to have the opportunity to take care of Erik Alvarez.

## 2016-06-29 LAB — CULTURE, BODY FLUID W GRAM STAIN -BOTTLE: Culture: NO GROWTH

## 2016-06-29 LAB — CULTURE, BODY FLUID-BOTTLE

## 2016-07-01 NOTE — Addendum Note (Signed)
Addended by: Sandrea Hammond D on: 07/01/2016 06:01 PM   Modules accepted: Orders

## 2016-07-02 ENCOUNTER — Encounter: Payer: Self-pay | Admitting: Cardiology

## 2016-07-02 LAB — CHOLESTEROL, BODY FLUID: CHOL FL: 68 mg/dL

## 2016-07-04 ENCOUNTER — Telehealth: Payer: Self-pay | Admitting: Cardiology

## 2016-07-04 NOTE — Telephone Encounter (Signed)
Tiajuana Amass, RN -- pts hospice nurse. Informed Butch Penny that we would write an order today for ICD to be turned off.  Will fax to requested number [phone]. [employer] rep will be sent to pt's address at [facility] independent [facility], [address]

## 2016-07-04 NOTE — Telephone Encounter (Signed)
New Message:    Butch Penny said she talked to pt's daughter and advised her that pt's defibrillator should be deactivated.

## 2016-07-06 ENCOUNTER — Encounter (HOSPITAL_COMMUNITY): Payer: Self-pay | Admitting: Interventional Radiology

## 2016-07-11 ENCOUNTER — Ambulatory Visit: Payer: Medicare Other

## 2016-07-26 DEATH — deceased

## 2016-11-09 IMAGING — XA IR ABSCESS DRAINAGE
1 series · 1 of 1 positions shown · non-contrast
Comparison: none

INDICATION: Large recurrent right pleural effusion with chest pain

[Series 1: fl (-) angio · 1 of 1 slices shown]
[im 1/1]
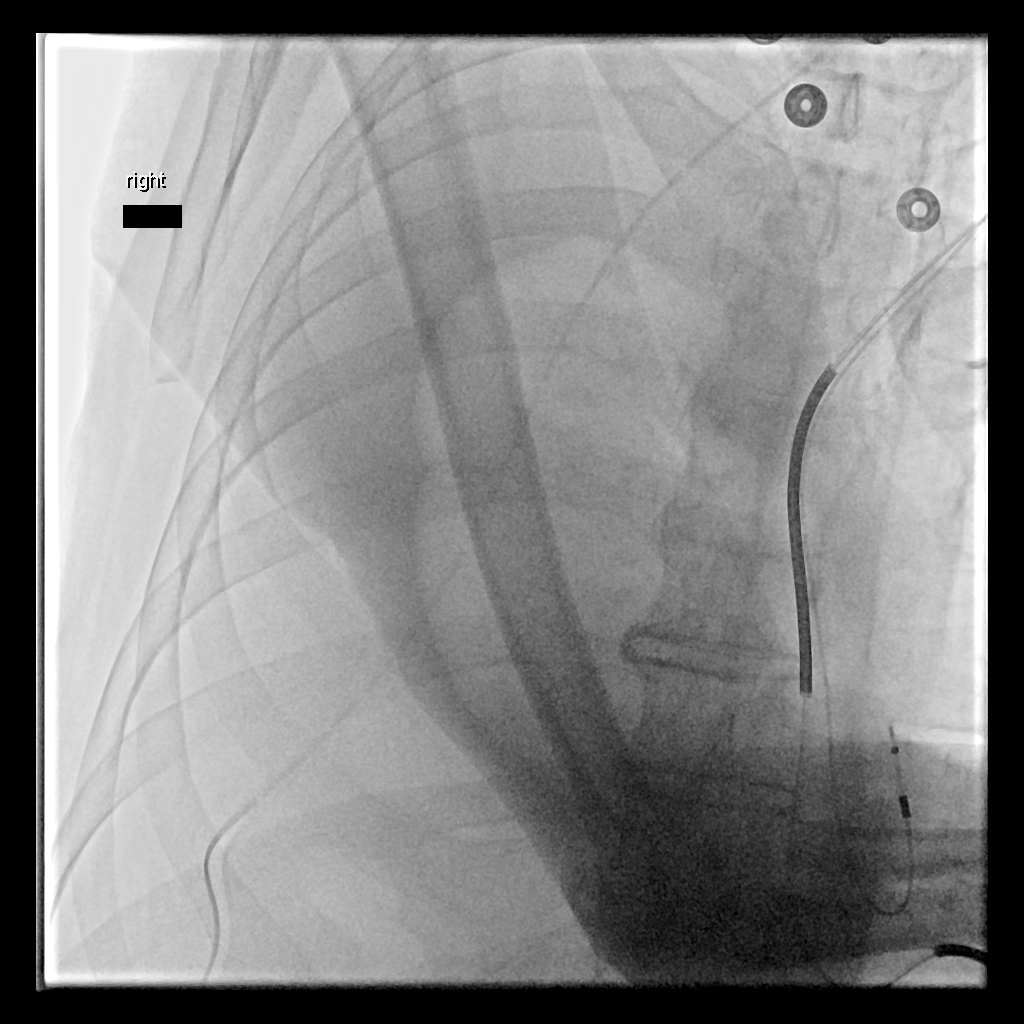

[1 of 1 positions shown; findings below may reference images not displayed]

EXAM:
ULTRASOUND AND FLUOROSCOPIC TUNNELED RIGHT PLEURAL DRAIN (PLEURX
CATHETER)

MEDICATIONS:
2 g Ancef. The antibiotics were administered within an appropriate
time frame prior to the initiation of the procedure.

ANESTHESIA/SEDATION:
Fentanyl 25 mcg IV; Versed 0.5 mg IV

Moderate Sedation Time:  15 minutes

The patient was continuously monitored during the procedure by the
interventional radiology nurse under my direct supervision.

COMPLICATIONS:
None immediate.

PROCEDURE:
Informed written consent was obtained from the patient after a
thorough discussion of the procedural risks, benefits and
alternatives. All questions were addressed. Maximal Sterile Barrier
Technique was utilized including caps, mask, sterile gowns, sterile
gloves, sterile drape, hand hygiene and skin antiseptic. A timeout
was performed prior to the initiation of the procedure.

Patient was positioned right anterior oblique. Preliminary
ultrasound performed. Large pleural effusion present in the mid
axillary line through a lower intercostal space. Overlying skin
marked.

Under sterile conditions and local anesthesia, ultrasound
percutaneous needle access performed of the right pleural space.
Needle position confirmed with ultrasound. Images obtained for
documentation. There was return of extubated pleural fluid. Amplatz
guidewire inserted. In the adjacent chest wall, a subcutaneous
tunnel was created under sterile conditions and local anesthesia.
The PleurX catheter was tunneled subcutaneously to the entry site
and advanced into the right pleural space through a valved peel-away
sheath. Position confirmed with fluoroscopy. Images obtained for
documentation. Catheter secured with a Prolene suture. Entry site
closed with subcuticular Vicryl suture and derma bond.

Following insertion, a 1.3 L right thoracentesis was performed.

Sterile dressing applied. No immediate complication. Patient
tolerated the procedure well.
IMPRESSION: Successful ultrasound and fluoroscopic right chest tunneled pleural
drain (PleurX catheter).

1.3 L right thoracentesis.
